# Patient Record
Sex: Female | Born: 1995
Health system: Southern US, Community
[De-identification: ages and names within clinical notes are randomized; demographics above are authoritative.]

## PROBLEM LIST (undated history)

## (undated) DIAGNOSIS — F909 Attention-deficit hyperactivity disorder, unspecified type: Secondary | ICD-10-CM

## (undated) DIAGNOSIS — K5090 Crohn's disease, unspecified, without complications: Secondary | ICD-10-CM

## (undated) DIAGNOSIS — M069 Rheumatoid arthritis, unspecified: Secondary | ICD-10-CM

## (undated) DIAGNOSIS — H209 Unspecified iridocyclitis: Secondary | ICD-10-CM

## (undated) HISTORY — PX: GLAUCOMA SURGERY: SHX656

## (undated) HISTORY — PX: CATARACT EXTRACTION: SUR2

## (undated) HISTORY — PX: COLONOSCOPY: SHX174

---
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
## 2017-11-12 ENCOUNTER — Encounter: Payer: Self-pay | Admitting: Podiatry

## 2017-11-12 ENCOUNTER — Ambulatory Visit (INDEPENDENT_AMBULATORY_CARE_PROVIDER_SITE_OTHER): Payer: BLUE CROSS/BLUE SHIELD | Admitting: Podiatry

## 2017-11-12 DIAGNOSIS — L600 Ingrowing nail: Secondary | ICD-10-CM | POA: Diagnosis not present

## 2017-11-12 NOTE — Patient Instructions (Signed)

## 2017-11-12 NOTE — Progress Notes (Signed)
Subjective:   Patient ID: Sydney Zimmerman, female   DOB: 22 y.o.   MRN: [id number]   HPI 22 year old female presents the office today for concerns of a possible ingrown toenail to the right big toe.  She states that she had a piece of nail come off and since part of the corner of the nail, she has not been having any pain.  She denies any drainage or pus and denies any swelling or redness.  She said no other treatment.  No recent injury.  She has no other concerns today.   Review of Systems  All other systems reviewed and are negative.  History reviewed. No pertinent past medical history.  History reviewed. No pertinent surgical history.  No current outpatient medications on file.  Allergies  Allergen Reactions  . Sulfamethoxazole-Trimethoprim Hives and Itching    Patient developed urticarial rash 3-4 days after starting Bactrim   . Ibuprofen   . Sulfamethoxazole Itching  . Adalimumab Rash          Objective:  Physical Exam  General: AAO x3, NAD  Dermatological: Along the medial aspect the right hallux toenail there is some evidence of dry skin within the nail corner but there is no significant incurvation.  It does appear that a piece of the medial distal portion of the nail did come off and there is no incurvation present.  There is no edema, erythema, drainage or pus and there is no clinical signs of infection.  There is no tenderness palpation the nail corner. No open lesions.  Vascular: Dorsalis Pedis artery and Posterior Tibial artery pedal pulses are 2/4 bilateral with immedate capillary fill time. There is no pain with calf compression, swelling, warmth, erythema.   Neruologic: Grossly intact via light touch bilateral. Protective threshold with Semmes Wienstein monofilament intact to all pedal sites bilateral.   Musculoskeletal: HAV is present. No pain, crepitus, or limitation noted with foot and ankle range of motion bilateral. Muscular strength 5/5 in all groups  tested bilateral.  Gait: Unassisted, Nonantalgic.       Assessment:   Ingrown toenail right hallux     Plan:  -Treatment options discussed including all alternatives, risks, and complications -Etiology of symptoms were discussed -At this time there is no pain there is no signs of infection.  There is no significant incurvation of the nail.  Because this we held off on a partial nail avulsion.  I did clean out the corner of the nail to remove some dried skin present.  Monitoring signs or symptoms of infection or any recurrence.  If she were to have issues I do think that a partial nail avulsion with chemical measures with me would be helpful for her but she may continue to monitor the area for now.  Follow back up as needed.  Call any questions or concerns.    Trula Slade DPM

## 2018-11-26 ENCOUNTER — Encounter: Payer: Self-pay | Admitting: Family Medicine

## 2019-03-21 ENCOUNTER — Encounter (HOSPITAL_COMMUNITY): Payer: Self-pay

## 2019-03-21 ENCOUNTER — Emergency Department (HOSPITAL_COMMUNITY): Payer: BC Managed Care – PPO

## 2019-03-21 ENCOUNTER — Other Ambulatory Visit: Payer: Self-pay

## 2019-03-21 ENCOUNTER — Observation Stay (HOSPITAL_COMMUNITY)
Admission: EM | Admit: 2019-03-21 | Discharge: 2019-03-22 | Disposition: A | Discharge status: Home / Self Care | Payer: BC Managed Care – PPO | Attending: Internal Medicine | Admitting: Internal Medicine

## 2019-03-21 DIAGNOSIS — Z886 Allergy status to analgesic agent status: Secondary | ICD-10-CM | POA: Insufficient documentation

## 2019-03-21 DIAGNOSIS — K5090 Crohn's disease, unspecified, without complications: Secondary | ICD-10-CM | POA: Diagnosis not present

## 2019-03-21 DIAGNOSIS — E876 Hypokalemia: Secondary | ICD-10-CM | POA: Diagnosis present

## 2019-03-21 DIAGNOSIS — R000 Tachycardia, unspecified: Secondary | ICD-10-CM | POA: Insufficient documentation

## 2019-03-21 DIAGNOSIS — Z20828 Contact with and (suspected) exposure to other viral communicable diseases: Secondary | ICD-10-CM | POA: Insufficient documentation

## 2019-03-21 DIAGNOSIS — Z888 Allergy status to other drugs, medicaments and biological substances status: Secondary | ICD-10-CM | POA: Diagnosis not present

## 2019-03-21 DIAGNOSIS — Z882 Allergy status to sulfonamides status: Secondary | ICD-10-CM | POA: Insufficient documentation

## 2019-03-21 DIAGNOSIS — R509 Fever, unspecified: Secondary | ICD-10-CM | POA: Diagnosis not present

## 2019-03-21 DIAGNOSIS — R6510 Systemic inflammatory response syndrome (SIRS) of non-infectious origin without acute organ dysfunction: Secondary | ICD-10-CM | POA: Diagnosis present

## 2019-03-21 DIAGNOSIS — R7989 Other specified abnormal findings of blood chemistry: Secondary | ICD-10-CM | POA: Insufficient documentation

## 2019-03-21 DIAGNOSIS — Z79899 Other long term (current) drug therapy: Secondary | ICD-10-CM | POA: Insufficient documentation

## 2019-03-21 DIAGNOSIS — M0800 Unspecified juvenile rheumatoid arthritis of unspecified site: Secondary | ICD-10-CM | POA: Diagnosis not present

## 2019-03-21 DIAGNOSIS — Z7952 Long term (current) use of systemic steroids: Secondary | ICD-10-CM | POA: Diagnosis not present

## 2019-03-21 DIAGNOSIS — Z881 Allergy status to other antibiotic agents status: Secondary | ICD-10-CM | POA: Insufficient documentation

## 2019-03-21 LAB — CBC WITH DIFFERENTIAL/PLATELET
Abs Immature Granulocytes: 0.03 10*3/uL (ref 0.00–0.07)
Basophils Absolute: 0 10*3/uL (ref 0.0–0.1)
Basophils Relative: 0 %
Eosinophils Absolute: 0.1 10*3/uL (ref 0.0–0.5)
Eosinophils Relative: 1 %
HCT: 38.6 % (ref 36.0–46.0)
Hemoglobin: 12.3 g/dL (ref 12.0–15.0)
Immature Granulocytes: 0 %
Lymphocytes Relative: 19 %
Lymphs Abs: 1.8 10*3/uL (ref 0.7–4.0)
MCH: 25.9 pg — ABNORMAL LOW (ref 26.0–34.0)
MCHC: 31.9 g/dL (ref 30.0–36.0)
MCV: 81.3 fL (ref 80.0–100.0)
Monocytes Absolute: 1.2 10*3/uL — ABNORMAL HIGH (ref 0.1–1.0)
Monocytes Relative: 13 %
Neutro Abs: 6.2 10*3/uL (ref 1.7–7.7)
Neutrophils Relative %: 67 %
Platelets: 468 10*3/uL — ABNORMAL HIGH (ref 150–400)
RBC: 4.75 MIL/uL (ref 3.87–5.11)
RDW: 15.9 % — ABNORMAL HIGH (ref 11.5–15.5)
WBC: 9.4 10*3/uL (ref 4.0–10.5)
nRBC: 0 % (ref 0.0–0.2)

## 2019-03-21 LAB — URINALYSIS, ROUTINE W REFLEX MICROSCOPIC
Bacteria, UA: NONE SEEN
Bilirubin Urine: NEGATIVE
Glucose, UA: NEGATIVE mg/dL
Ketones, ur: NEGATIVE mg/dL
Leukocytes,Ua: NEGATIVE
Nitrite: NEGATIVE
Protein, ur: NEGATIVE mg/dL
Specific Gravity, Urine: 1.027 (ref 1.005–1.030)
pH: 5 (ref 5.0–8.0)

## 2019-03-21 LAB — COMPREHENSIVE METABOLIC PANEL
ALT: 18 U/L (ref 0–44)
AST: 18 U/L (ref 15–41)
Albumin: 3.3 g/dL — ABNORMAL LOW (ref 3.5–5.0)
Alkaline Phosphatase: 61 U/L (ref 38–126)
Anion gap: 11 (ref 5–15)
BUN: 7 mg/dL (ref 6–20)
CO2: 23 mmol/L (ref 22–32)
Calcium: 8.9 mg/dL (ref 8.9–10.3)
Chloride: 101 mmol/L (ref 98–111)
Creatinine, Ser: 0.85 mg/dL (ref 0.44–1.00)
GFR calc Af Amer: >60 mL/min (ref >60)
GFR calc non Af Amer: >60 mL/min (ref >60)
Glucose, Bld: 93 mg/dL (ref 70–99)
Potassium: 3.3 mmol/L — ABNORMAL LOW (ref 3.5–5.1)
Sodium: 135 mmol/L (ref 135–145)
Total Bilirubin: 0.4 mg/dL (ref 0.3–1.2)
Total Protein: 8.9 g/dL — ABNORMAL HIGH (ref 6.5–8.1)

## 2019-03-21 LAB — PROTIME-INR
INR: 1.1 (ref 0.8–1.2)
Prothrombin Time: 14.1 seconds (ref 11.4–15.2)

## 2019-03-21 LAB — I-STAT BETA HCG BLOOD, ED (MC, WL, AP ONLY): I-stat hCG, quantitative: <5 m[IU]/mL (ref <5)

## 2019-03-21 LAB — LACTIC ACID, PLASMA: Lactic Acid, Venous: 1.3 mmol/L (ref 0.5–1.9)

## 2019-03-21 MED ORDER — SODIUM CHLORIDE 0.9 % IV BOLUS (SEPSIS)
1000.0000 mL | Freq: Once | INTRAVENOUS | Status: AC
  Administered 2019-03-21: 1000 mL via INTRAVENOUS

## 2019-03-21 MED ORDER — SODIUM CHLORIDE 0.9 % IV SOLN
1000.0000 mL | INTRAVENOUS | Status: DC
  Administered 2019-03-22: 1000 mL via INTRAVENOUS

## 2019-03-21 MED ORDER — HYDROCORTISONE NA SUCCINATE PF 100 MG IJ SOLR
100.0000 mg | Freq: Once | INTRAMUSCULAR | Status: AC
  Administered 2019-03-21: 100 mg via INTRAVENOUS
  Filled 2019-03-21: qty 2

## 2019-03-21 MED ORDER — ACETAMINOPHEN 325 MG PO TABS
650.0000 mg | ORAL_TABLET | Freq: Once | ORAL | Status: AC
  Administered 2019-03-21: 650 mg via ORAL
  Filled 2019-03-21: qty 2

## 2019-03-21 NOTE — ED Provider Notes (Signed)
[facility] EMERGENCY DEPARTMENT Provider Note   CSN: [id number] Arrival date & time: 03/21/19  2133     History   Chief Complaint Chief Complaint  Patient presents with   Fever    HPI Sydney Zimmerman is a 23 y.o. female with history of IBS, juvenile idiopathic arthritis, glaucoma Crohn's disease, atopic dermatitis, chronic iridocyclitis of right eye presents for evaluation of 1 week of fever, myalgias, right flank pain.  She reports very mild dull aching to the right flank which does not radiate, no aggravating or alleviating factors noted.  No known trauma, falls, bending or lifting or twisting injuries.  Denies any pain radiating to the lower extremities.  No bowel or bladder incontinence, saddle anesthesia, IV drug use, urinary symptoms.  She has had a few days of watery nonbloody stools but reports that this is not unusual for her in relation to her IBS or Crohn's disease.  She reports very mild generalized abdominal pain in the mornings which is also not unusual for her and denies any abdominal pain at this time.  Denies shortness of breath, cough, or shortness of breath.  She has had fevers at home which she has been treating with Tylenol.  Reports that she is typically on 5 mg of prednisone twice daily but ran out of her prednisone 1 week ago.  She states she had similar symptoms in August a few months ago and was tested for influenza, COVID-19, and pneumonia and reports she tested negative with improvement in symptoms.  No known sick contacts.  Of note, she does take Simponi (golimumab) injections once monthly which is an immunosuppressive medication.     The history is provided by the patient.    Past Medical History:  Diagnosis Date   Crohn disease ([place]) 03/22/2019    Patient Active Problem List   Diagnosis Date Noted   SIRS (systemic inflammatory response syndrome) ([place]) 03/22/2019   Hypokalemia 03/22/2019   Crohn disease ([place]) 03/22/2019   Juvenile  rheumatoid arthritis ([place]) 03/22/2019    History reviewed. No pertinent surgical history.   OB History   No obstetric history on file.      Home Medications    Prior to Admission medications   Not on File    Family History History reviewed. No pertinent family history.  Social History Social History   Tobacco Use   Smoking status: Not on file  Substance Use Topics   Alcohol use: Not on file   Drug use: Not on file     Allergies   Sulfamethoxazole-trimethoprim, Ibuprofen, Sulfamethoxazole, and Adalimumab   Review of Systems Review of Systems  Constitutional: Positive for chills and fever.  Respiratory: Negative for cough and shortness of breath.   Cardiovascular: Negative for chest pain.  Gastrointestinal: Positive for abdominal pain (intermittent) and diarrhea. Negative for nausea and vomiting.  Genitourinary: Positive for flank pain.  Musculoskeletal: Positive for arthralgias and myalgias. Negative for neck pain and neck stiffness.  Neurological: Negative for weakness, numbness and headaches.  All other systems reviewed and are negative.    Physical Exam Updated Vital Signs BP 111/67    Pulse 93    Temp 98.2 F (36.8 C) (Oral)    Resp 12    Ht 5' 3"  (1.6 m)    Wt 63.5 kg    LMP 03/07/2019 (Approximate)    SpO2 99%    BMI 24.80 kg/m   Physical Exam Vitals signs and nursing note reviewed.  Constitutional:  General: She is not in acute distress.    Appearance: She is well-developed.  HENT:     Head: Normocephalic and atraumatic.  Eyes:     General:        Right eye: No discharge.        Left eye: No discharge.     Conjunctiva/sclera: Conjunctivae normal.  Neck:     Musculoskeletal: Normal range of motion and neck supple. No neck rigidity.     Vascular: No JVD.     Trachea: No tracheal deviation.  Cardiovascular:     Rate and Rhythm: Regular rhythm. Tachycardia present.     Pulses: Normal pulses.     Heart sounds: Normal heart sounds.      Comments: 2+ radial and DP/PT pulses bilaterally, Homans sign absent bilaterally, no lower extremity edema, no palpable cords, compartments are soft  Pulmonary:     Effort: Pulmonary effort is normal.     Breath sounds: Normal breath sounds.  Abdominal:     General: Abdomen is flat. Bowel sounds are normal. There is no distension.     Palpations: Abdomen is soft.     Tenderness: There is no abdominal tenderness. There is no right CVA tenderness, left CVA tenderness, guarding or rebound.  Musculoskeletal:       Arms:     Comments: No midline lumbar spine tenderness, no deformity, crepitus, or step-off noted.  Mild right lower  paralumbar muscle tenderness.  5/5 strength of BLE major muscle groups.  Negative straight leg raise bilaterally  Skin:    General: Skin is warm and dry.     Findings: No erythema.  Neurological:     Mental Status: She is alert.     Comments: Fluent speech, no facial droop, sensation intact to soft touch of bilateral lower extremities.   Psychiatric:        Behavior: Behavior normal.      ED Treatments / Results  Labs (all labs ordered are listed, but only abnormal results are displayed) Labs Reviewed  COMPREHENSIVE METABOLIC PANEL - Abnormal; Notable for the following components:      Result Value   Potassium 3.3 (*)    Total Protein 8.9 (*)    Albumin 3.3 (*)    All other components within normal limits  CBC WITH DIFFERENTIAL/PLATELET - Abnormal; Notable for the following components:   MCH 25.9 (*)    RDW 15.9 (*)    Platelets 468 (*)    Monocytes Absolute 1.2 (*)    All other components within normal limits  URINALYSIS, ROUTINE W REFLEX MICROSCOPIC - Abnormal; Notable for the following components:   APPearance HAZY (*)    Hgb urine dipstick SMALL (*)    All other components within normal limits  SARS CORONAVIRUS 2 (HOSPITAL ORDER, [facility])  URINE CULTURE  CULTURE, BLOOD (ROUTINE X 2)  CULTURE, BLOOD (ROUTINE X 2)    LACTIC ACID, PLASMA  LACTIC ACID, PLASMA  PROTIME-INR  I-STAT BETA HCG BLOOD, ED ([facility], [facility], [facility] ONLY)    EKG EKG Interpretation  Date/Time:  Monday March 21 2019 21:51:43 EDT Ventricular Rate:  149 PR Interval:  126 QRS Duration: 68 QT Interval:  212 QTC Calculation: 333 R Axis:   87 Text Interpretation:  Sinus tachycardia Right atrial enlargement ST & T wave abnormality, consider inferior ischemia Abnormal ECG No old tracing to compare Confirmed by Noemi Chapel [phone]) on 03/21/2019 10:31:40 PM   Radiology Ct Abdomen Pelvis W Contrast  Result  Date: 03/22/2019 CLINICAL DATA:  Flank pain, fever EXAM: CT ABDOMEN AND PELVIS WITH CONTRAST TECHNIQUE: Multidetector CT imaging of the abdomen and pelvis was performed using the standard protocol following bolus administration of intravenous contrast. CONTRAST:  12m OMNIPAQUE IOHEXOL 300 MG/ML  SOLN COMPARISON:  None. FINDINGS: Lower chest: The visualized heart size within normal limits. No pericardial fluid/thickening. No hiatal hernia. The visualized portions of the lungs are clear. Hepatobiliary: The liver is normal in density without focal abnormality.The main portal vein is patent. No evidence of calcified gallstones, gallbladder wall thickening or biliary dilatation. Pancreas: Unremarkable. No pancreatic ductal dilatation or surrounding inflammatory changes. Spleen: Normal in size without focal abnormality. Adrenals/Urinary Tract: Both adrenal glands appear normal. The kidneys and collecting system are normal in appearance. No hydronephrosis or perinephric stranding. There does appear to be mild bladder wall thickening with question of surrounding superior bladder fat stranding changes. Stomach/Bowel: The stomach, small bowel, and colon are normal in appearance. No inflammatory changes, wall thickening, or obstructive findings.The appendix is normal. Vascular/Lymphatic: There are no enlarged mesenteric, retroperitoneal, or pelvic lymph nodes. No  significant vascular findings are present. Reproductive: The uterus and adnexa are unremarkable. a small amount of free fluid seen within the cul-de-sac. Other: No evidence of abdominal wall mass or hernia. Musculoskeletal: No acute or significant osseous findings. IMPRESSION: Findings which could be suggestive of cystitis. No renal or collecting system calculi. Electronically Signed   By: BPrudencio PairM.D.   On: 03/22/2019 00:53   Dg Chest Portable 1 View  Result Date: 03/21/2019 CLINICAL DATA:  Fever EXAM: PORTABLE CHEST 1 VIEW COMPARISON:  None. FINDINGS: The heart size and mediastinal contours are within normal limits. Both lungs are clear. The visualized skeletal structures are unremarkable. IMPRESSION: No active disease. Electronically Signed   By: BPrudencio PairM.D.   On: 03/21/2019 23:20    Procedures .Critical Care Performed by: FRenita Papa PA-C Authorized by: FRenita Papa PA-C   Critical care provider statement:    Critical care time (minutes):  45   Critical care was necessary to treat or prevent imminent or life-threatening deterioration of the following conditions:  Sepsis   Critical care was time spent personally by me on the following activities:  Discussions with consultants, evaluation of patient's response to treatment, examination of patient, ordering and performing treatments and interventions, ordering and review of laboratory studies, ordering and review of radiographic studies, pulse oximetry, re-evaluation of patient's condition, obtaining history from patient or surrogate and review of old charts   (including critical care time)  Medications Ordered in ED Medications  sodium chloride 0.9 % bolus 1,000 mL (0 mLs Intravenous Stopped 03/21/19 2257)    Followed by  sodium chloride 0.9 % bolus 1,000 mL (0 mLs Intravenous Stopped 03/21/19 2353)    Followed by  0.9 %  sodium chloride infusion (1,000 mLs Intravenous New Bag/Given 03/22/19 0303)  vancomycin (VANCOCIN) 1,250  mg in sodium chloride 0.9 % 250 mL IVPB (1,250 mg Intravenous New Bag/Given 03/22/19 0417)  vancomycin (VANCOCIN) IVPB 750 mg/150 ml premix (has no administration in time range)  potassium chloride SA (KLOR-CON) CR tablet 40 mEq (has no administration in time range)  ceFEPIme (MAXIPIME) 2 g in sodium chloride 0.9 % 100 mL IVPB (has no administration in time range)  acetaminophen (TYLENOL) tablet 650 mg (650 mg Oral Given 03/21/19 2154)  hydrocortisone sodium succinate (SOLU-CORTEF) 100 MG injection 100 mg (100 mg Intravenous Given 03/21/19 2323)  iohexol (OMNIPAQUE) 300 MG/ML solution 100  mL (100 mLs Intravenous Contrast Given 03/22/19 0016)  cefTRIAXone (ROCEPHIN) 1 g in sodium chloride 0.9 % 100 mL IVPB (0 g Intravenous Stopped 03/22/19 0317)  metroNIDAZOLE (FLAGYL) IVPB 500 mg (500 mg Intravenous New Bag/Given 03/22/19 0306)     Initial Impression / Assessment and Plan / ED Course  I have reviewed the triage vital signs and the nursing notes.  Pertinent labs & imaging results that were available during my care of the patient were reviewed by me and considered in my medical decision making (see chart for details).       Sydney Zimmerman was evaluated in Emergency Department on 03/22/2019 for the symptoms described in the history of present illness. She was evaluated in the context of the global COVID-19 pandemic, which necessitated consideration that the patient might be at risk for infection with the SARS-CoV-2 virus that causes COVID-19. Institutional protocols and algorithms that pertain to the evaluation of patients at risk for COVID-19 are in a state of rapid change based on information released by regulatory bodies including the CDC and federal and state organizations. These policies and algorithms were followed during the patient's care in the ED.  Patient presenting for evaluation of fever, myalgias, right flank pain for 1 week.  She is febrile to 102.9 F and tachycardia to 148 bpm on initial  examination.  She was given a 30 cc/kg bolus of fluids with some improvement in her heart rate but on reassessment in the room she is still tachycardic to the 120s.  Overall well-appearing, examination of the abdomen is benign.  Lungs clear to auscultation bilaterally and SPO2 saturations are within normal limits.  We will obtain blood cultures, blood work, chest x-ray and UA to search for source of infection.  She is at risk of serious infections due to her immunocompromised status.  Lab work today reviewed by me shows no leukocytosis, no anemia, no metabolic derangements.  She is mildly hypokalemic, replenished orally in the ED.  No renal insufficiency.  Lactate within normal limits and she does not appear to be overtly septic at this time however does meet sirs criteria.  CT of the abdomen and pelvis shows possible mild bladder wall thickening with question of surrounding fat stranding changes which could suggest cystitis however her UA is not concerning for UTI.  We will culture.  Chest x-ray shows no acute cardiopulmonary abnormalities.  COVID test is negative.  Will start on broad-spectrum antibiotics and admit for fever of unknown origin.  Spoke with Dr. Myna Hidalgo with [facility] hospitalist service who agrees to assume care of patient and bring her into the hospital for further evaluation and management.  Final Clinical Impressions(s) / ED Diagnoses   Final diagnoses:  Fever of unknown origin    ED Discharge Orders    None       Debroah Baller 03/22/19 Eulogio Bear, MD 03/25/19 [phone]

## 2019-03-21 NOTE — ED Triage Notes (Signed)
Pt states that she has not been feel good for the past week, nausea, some dizziness, fevers. HR 150, fever 102

## 2019-03-22 ENCOUNTER — Encounter (HOSPITAL_COMMUNITY): Payer: Self-pay | Admitting: Family Medicine

## 2019-03-22 ENCOUNTER — Emergency Department (HOSPITAL_COMMUNITY): Payer: BC Managed Care – PPO

## 2019-03-22 ENCOUNTER — Other Ambulatory Visit: Payer: Self-pay

## 2019-03-22 DIAGNOSIS — E876 Hypokalemia: Secondary | ICD-10-CM | POA: Diagnosis not present

## 2019-03-22 DIAGNOSIS — R6510 Systemic inflammatory response syndrome (SIRS) of non-infectious origin without acute organ dysfunction: Secondary | ICD-10-CM | POA: Diagnosis present

## 2019-03-22 DIAGNOSIS — K5090 Crohn's disease, unspecified, without complications: Secondary | ICD-10-CM | POA: Diagnosis present

## 2019-03-22 DIAGNOSIS — M0800 Unspecified juvenile rheumatoid arthritis of unspecified site: Secondary | ICD-10-CM | POA: Diagnosis present

## 2019-03-22 DIAGNOSIS — K50919 Crohn's disease, unspecified, with unspecified complications: Secondary | ICD-10-CM

## 2019-03-22 HISTORY — DX: Crohn's disease, unspecified, without complications: K50.90

## 2019-03-22 LAB — CBC WITH DIFFERENTIAL/PLATELET
Abs Immature Granulocytes: 0.02 10*3/uL (ref 0.00–0.07)
Basophils Absolute: 0 10*3/uL (ref 0.0–0.1)
Basophils Relative: 0 %
Eosinophils Absolute: 0 10*3/uL (ref 0.0–0.5)
Eosinophils Relative: 0 %
HCT: 33.4 % — ABNORMAL LOW (ref 36.0–46.0)
Hemoglobin: 10.9 g/dL — ABNORMAL LOW (ref 12.0–15.0)
Immature Granulocytes: 0 %
Lymphocytes Relative: 12 %
Lymphs Abs: 0.8 10*3/uL (ref 0.7–4.0)
MCH: 26.7 pg (ref 26.0–34.0)
MCHC: 32.6 g/dL (ref 30.0–36.0)
MCV: 81.9 fL (ref 80.0–100.0)
Monocytes Absolute: 0.5 10*3/uL (ref 0.1–1.0)
Monocytes Relative: 7 %
Neutro Abs: 5.5 10*3/uL (ref 1.7–7.7)
Neutrophils Relative %: 81 %
Platelets: 394 10*3/uL (ref 150–400)
RBC: 4.08 MIL/uL (ref 3.87–5.11)
RDW: 15.6 % — ABNORMAL HIGH (ref 11.5–15.5)
WBC: 6.8 10*3/uL (ref 4.0–10.5)
nRBC: 0 % (ref 0.0–0.2)

## 2019-03-22 LAB — BASIC METABOLIC PANEL
Anion gap: 9 (ref 5–15)
BUN: 6 mg/dL (ref 6–20)
CO2: 21 mmol/L — ABNORMAL LOW (ref 22–32)
Calcium: 8.2 mg/dL — ABNORMAL LOW (ref 8.9–10.3)
Chloride: 108 mmol/L (ref 98–111)
Creatinine, Ser: 0.62 mg/dL (ref 0.44–1.00)
GFR calc Af Amer: >60 mL/min (ref >60)
GFR calc non Af Amer: >60 mL/min (ref >60)
Glucose, Bld: 106 mg/dL — ABNORMAL HIGH (ref 70–99)
Potassium: 3.8 mmol/L (ref 3.5–5.1)
Sodium: 138 mmol/L (ref 135–145)

## 2019-03-22 LAB — URINE CULTURE: Culture: NO GROWTH

## 2019-03-22 LAB — SARS CORONAVIRUS 2 BY RT PCR (HOSPITAL ORDER, PERFORMED IN ~~LOC~~ HOSPITAL LAB): SARS Coronavirus 2: NEGATIVE

## 2019-03-22 LAB — MAGNESIUM: Magnesium: 2 mg/dL (ref 1.7–2.4)

## 2019-03-22 LAB — HIV ANTIBODY (ROUTINE TESTING W REFLEX): HIV Screen 4th Generation wRfx: NONREACTIVE

## 2019-03-22 LAB — LACTIC ACID, PLASMA: Lactic Acid, Venous: 0.9 mmol/L (ref 0.5–1.9)

## 2019-03-22 MED ORDER — SODIUM CHLORIDE 0.9 % IV SOLN
INTRAVENOUS | Status: AC
  Administered 2019-03-22 (×2): via INTRAVENOUS

## 2019-03-22 MED ORDER — SODIUM CHLORIDE 0.9 % IV SOLN
1.0000 g | Freq: Once | INTRAVENOUS | Status: AC
  Administered 2019-03-22: 1 g via INTRAVENOUS
  Filled 2019-03-22: qty 10

## 2019-03-22 MED ORDER — HYDROCODONE-ACETAMINOPHEN 5-325 MG PO TABS: 1.0000 | ORAL_TABLET | ORAL | Status: DC | PRN

## 2019-03-22 MED ORDER — ACETAMINOPHEN 325 MG PO TABS
650.0000 mg | ORAL_TABLET | Freq: Four times a day (QID) | ORAL | Status: DC | PRN
  Administered 2019-03-22: 650 mg via ORAL
  Filled 2019-03-22: qty 2

## 2019-03-22 MED ORDER — ENOXAPARIN SODIUM 40 MG/0.4ML ~~LOC~~ SOLN
40.0000 mg | SUBCUTANEOUS | Status: DC
  Filled 2019-03-22: qty 0.4

## 2019-03-22 MED ORDER — VANCOMYCIN HCL 10 G IV SOLR
1250.0000 mg | Freq: Once | INTRAVENOUS | Status: AC
  Administered 2019-03-22: 1250 mg via INTRAVENOUS
  Filled 2019-03-22: qty 1250

## 2019-03-22 MED ORDER — POTASSIUM CHLORIDE CRYS ER 20 MEQ PO TBCR
40.0000 meq | EXTENDED_RELEASE_TABLET | Freq: Once | ORAL | Status: AC
  Administered 2019-03-22: 40 meq via ORAL
  Filled 2019-03-22: qty 2

## 2019-03-22 MED ORDER — IOHEXOL 300 MG/ML  SOLN
100.0000 mL | Freq: Once | INTRAMUSCULAR | Status: AC | PRN
  Administered 2019-03-22: 100 mL via INTRAVENOUS

## 2019-03-22 MED ORDER — ONDANSETRON HCL 4 MG/2ML IJ SOLN: 4.0000 mg | Freq: Four times a day (QID) | INTRAMUSCULAR | Status: DC | PRN

## 2019-03-22 MED ORDER — PREDNISONE 5 MG PO TABS
5.0000 mg | ORAL_TABLET | Freq: Two times a day (BID) | ORAL | Status: DC
  Administered 2019-03-22: 5 mg via ORAL
  Filled 2019-03-22 (×2): qty 1

## 2019-03-22 MED ORDER — VANCOMYCIN HCL IN DEXTROSE 750-5 MG/150ML-% IV SOLN
750.0000 mg | Freq: Two times a day (BID) | INTRAVENOUS | Status: DC
  Filled 2019-03-22: qty 150

## 2019-03-22 MED ORDER — METRONIDAZOLE IN NACL 5-0.79 MG/ML-% IV SOLN
500.0000 mg | Freq: Once | INTRAVENOUS | Status: AC
  Administered 2019-03-22: 500 mg via INTRAVENOUS
  Filled 2019-03-22: qty 100

## 2019-03-22 MED ORDER — ACETAMINOPHEN 650 MG RE SUPP: 650.0000 mg | Freq: Four times a day (QID) | RECTAL | Status: DC | PRN

## 2019-03-22 MED ORDER — PREDNISONE 5 MG PO TABS: 5.0000 mg | ORAL_TABLET | Freq: Two times a day (BID) | ORAL | 0 refills | Status: DC

## 2019-03-22 MED ORDER — PREDNISONE 10 MG PO TABS: ORAL_TABLET | ORAL | 0 refills | Status: DC

## 2019-03-22 MED ORDER — METRONIDAZOLE IN NACL 5-0.79 MG/ML-% IV SOLN
500.0000 mg | Freq: Three times a day (TID) | INTRAVENOUS | Status: DC
  Administered 2019-03-22: 500 mg via INTRAVENOUS
  Filled 2019-03-22: qty 100

## 2019-03-22 MED ORDER — ONDANSETRON HCL 4 MG PO TABS: 4.0000 mg | ORAL_TABLET | Freq: Four times a day (QID) | ORAL | Status: DC | PRN

## 2019-03-22 MED ORDER — VANCOMYCIN HCL IN DEXTROSE 1-5 GM/200ML-% IV SOLN: 1000.0000 mg | Freq: Once | INTRAVENOUS | Status: DC

## 2019-03-22 MED ORDER — SODIUM CHLORIDE 0.9 % IV SOLN
2.0000 g | Freq: Three times a day (TID) | INTRAVENOUS | Status: DC
  Administered 2019-03-22: 2 g via INTRAVENOUS
  Filled 2019-03-22: qty 2

## 2019-03-22 MED FILL — predniSONE 5 MG TABS: 5 | 15 days supply | Qty: 30 | Fill #0

## 2019-03-22 MED FILL — predniSONE 10 MG TABS: 10 | 10 days supply | Qty: 30 | Fill #0

## 2019-03-22 NOTE — ED Notes (Addendum)
Pt lying on bed, alert, on her cell phone. Female visitor remains at bedside. Pt states she was advised of tx plan - d/c to home. [facility] and advised will bring pt's meds.

## 2019-03-22 NOTE — Progress Notes (Addendum)
Patient admitted after midnight but care began before midnight in the ED.   23 yo hx chrons, juvenile RA taking chronic steroids (that she ran out of several days ago) admitted with SIRS. Max temp 102, tachycardia, tachypnea.   A/P 1. SIRS  Presented with 1 week of fever/chills and  found to be febrile and tachycardic with source not identified. Home meds include golimumab for RA and IBD, placing her at increased risk for life-threatening infections.  Lactic acid within limits of normal. Urinalysis unremarkable, chest xray no active disease.  Provided with vigorous IV fluids and  broad-spectrum antibiotics empirically  - Continue broad-spectrum antibiotics for now - follow blood  Cultures -continue vigorous IV fluids    2. Crohn's disease; juvenal rheumatoid arthritis   Patient reports these to be stable  - Continue prednisone   3. Hypokalemia  - Replaced, repeat chem panel in am    Dyanne Carrel, NP   Addendum:  Please see d/c summary.   Debbe Odea, MD

## 2019-03-22 NOTE — ED Notes (Signed)
MS ordered bfast

## 2019-03-22 NOTE — Progress Notes (Addendum)
Pharmacy Antibiotic Note  Sydney Zimmerman is a 23 y.o. female admitted on 03/21/2019 with empiric for unknown source.  Pharmacy has been consulted for vancomycin dosing.  Presenting with 1 week of fever, myalgias, and R flank pain. UA neg. CT image suggestive of cystitis. Hx Crohn's, IBS, idiopathic arthritis. WBC 9.4, LA 1.3>0.9, temp on admit 102.9> afebrile w/ APAP dosage. Scr 0.85 (CrCl 93 mL/min).   Plan: Vancomycin 1250 mg IV once then 750 mg IV every 12 hours  Cefepime 2 g IV every 8 hours Continue metronidazole per MD Monitor renal fx, cx results, clinical pic, opportunities for de-escalation, and vanc levels as appropriate  Height: 5' 3"  (160 cm) Weight: 140 lb (63.5 kg) IBW/kg (Calculated) : 52.4  Temp (24hrs), Avg:100.3 F (37.9 C), Min:98.2 F (36.8 C), Max:102.9 F (39.4 C)  Recent Labs  Lab 03/21/19 2200 03/22/19 0114  WBC 9.4  --   CREATININE 0.85  --   LATICACIDVEN 1.3 0.9    Estimated Creatinine Clearance: 93.1 mL/min (by C-G formula based on SCr of 0.85 mg/dL).    Allergies  Allergen Reactions  . Sulfamethoxazole-Trimethoprim Hives and Itching    Patient developed urticarial rash 3-4 days after starting Bactrim   . Ibuprofen   . Sulfamethoxazole Itching  . Adalimumab Rash    Antimicrobials this admission: Vancomycin 10/6 >>  Ceftriaxone 10/6 x1 Cefepime 10/6>>  Metronidazole 10/6>>  Dose adjustments this admission: N/A  Microbiology results: 10/6 COVID: neg 10/5 BCx: sent 10/5 UCx: sent   Thank you for allowing pharmacy to be a part of this patient's care.  Antonietta Jewel, PharmD, BCCCP Clinical Pharmacist   Please check AMION for all [place] phone numbers After 10:00 PM, call [place] [phone] 03/22/2019 2:39 AM

## 2019-03-22 NOTE — Discharge Summary (Addendum)
Physician Discharge Summary  Jesse Hirst TDS:[id number] DOB: 01-30-1996 DOA: 03/21/2019  PCP: Patient, No Pcp Per  Admit date: 03/21/2019 Discharge date: 03/22/2019  Time spent: 45 minutes  Recommendations for Outpatient Follow-up:  1. Follow up with Dr Shon Baton in 1-2 weeks for evaluation of symptoms   Discharge Diagnoses:  Principal Problem:   SIRS (systemic inflammatory response syndrome) (HCC) Active Problems:   Hypokalemia   Crohn disease ([place])   Juvenile rheumatoid arthritis ([place])   Discharge Condition: stable  Diet recommendation: regular  Filed Weights   03/21/19 2146  Weight: 63.5 kg    History of present illness:  Sydney Zimmerman is a 23 y.o. female with medical history significant for Crohn's disease and juvenile rheumatoid arthritis, presented to the emergency department 10/6 for evaluation of general malaise with fevers.  Patient reported that she developed fevers and chills close to a week prior, and had ongoing fevers, chills, sweats, and general malaise since then.  She had a couple loose stools early in the course but denied any further diarrhea, abdominal pain, or vomiting.  She denied any dysuria.  She denied cough, shortness of breath, rhinorrhea, or sore throat.  Denied neck stiffness or headaches.  She denied any wounds or rash.  No sick contacts or recent travel.  She ran out of her prednisone a few days prior as well.  Her Crohn's and rheumatoid arthritis have been fairly stable though she  had some general aches for the previous few days that she is attributing to the fevers.  Hospital Course:  1.SIRSPresented with 1 week of fever/chills and  found to be febrile and tachycardic with source not identified. Likely related to steroids. Home meds include golimumab for RA and IBD, placing her at increased risk for life-threatening infections.  Lactic acid within limits of normal. Urinalysis unremarkable, chest xray no active disease.  Provided with vigorous IV  fluids and  broad-spectrum antibiotics empiricallyas well as 129m Solucortef. This am symptoms resolved. Spoke to Dr. BShon Batonpatients rheumatilogist who recommended 10 day taper to baseline dose of 584mbid. Will follow blood cultures  2.Crohn's disease; juvenal rheumatoid arthritisPatient reported these to be stable but reported 2-4 stools during night with cramps.Continue prednisone  3.HypokalemiaReplaced and resolved.    Procedures:    Consultations:  [facility]: Vitals:   03/22/19 0703 03/22/19 1141  BP: 110/67 112/66  Pulse: (!) 104 84  Resp: (!) 24 16  Temp:  98.7 F (37.1 C)  SpO2: 99% 99%    General: awake alert oriented Cardiovascular: rrr no mgr no LE edema Respiratory: normal effort BS clear bilaterally  Discharge Instructions   Discharge Instructions    Diet - low sodium heart healthy   Complete by: As directed    Discharge instructions   Complete by: As directed    Take medications as prescribed.  Follow up with Dr. BeShon Batonn 1-2 weeks   Increase activity slowly   Complete by: As directed      Allergies as of 03/22/2019      Reactions   Sulfamethoxazole-trimethoprim Hives, Itching   Patient developed urticarial rash 3-4 days after starting Bactrim   Ibuprofen    Sulfamethoxazole Itching   Adalimumab Rash      Medication List    TAKE these medications   acetaminophen 650 MG CR tablet Commonly known as: TYLENOL Take 1,300 mg by mouth every 8 (eight) hours as needed for pain.   nepafenac 0.1 % ophthalmic  suspension Commonly known as: [place] 1 drop into the right eye 2 (two) times daily.   prednisoLONE acetate 1 % ophthalmic suspension Commonly known as: PRED FORTE Place 1 drop into the right eye daily.   predniSONE 10 MG tablet Commonly known as: DELTASONE Take take 5 tabs for 2 days starting 10/6 then take 4 tabs for 2 days then take 3 tabs for 2 days then take 2 tabs for 2  days then take 1 tabs for 2 days.   predniSONE 5 MG tablet Commonly known as: DELTASONE Take 1 tablet (5 mg total) by mouth 2 (two) times daily with a meal. Start taking on: April 01, 2019   Simponi 50 MG/0.5ML Soaj Generic drug: Golimumab Inject 0.5 mLs into the skin every 28 (twenty-eight) days.      Allergies  Allergen Reactions  . Sulfamethoxazole-Trimethoprim Hives and Itching    Patient developed urticarial rash 3-4 days after starting Bactrim   . Ibuprofen   . Sulfamethoxazole Itching  . Adalimumab Rash      The results of significant diagnostics from this hospitalization (including imaging, microbiology, ancillary and laboratory) are listed below for reference.    Significant Diagnostic Studies: Ct Abdomen Pelvis W Contrast  Result Date: 03/22/2019 CLINICAL DATA:  Flank pain, fever EXAM: CT ABDOMEN AND PELVIS WITH CONTRAST TECHNIQUE: Multidetector CT imaging of the abdomen and pelvis was performed using the standard protocol following bolus administration of intravenous contrast. CONTRAST:  188m OMNIPAQUE IOHEXOL 300 MG/ML  SOLN COMPARISON:  None. FINDINGS: Lower chest: The visualized heart size within normal limits. No pericardial fluid/thickening. No hiatal hernia. The visualized portions of the lungs are clear. Hepatobiliary: The liver is normal in density without focal abnormality.The main portal vein is patent. No evidence of calcified gallstones, gallbladder wall thickening or biliary dilatation. Pancreas: Unremarkable. No pancreatic ductal dilatation or surrounding inflammatory changes. Spleen: Normal in size without focal abnormality. Adrenals/Urinary Tract: Both adrenal glands appear normal. The kidneys and collecting system are normal in appearance. No hydronephrosis or perinephric stranding. There does appear to be mild bladder wall thickening with question of surrounding superior bladder fat stranding changes. Stomach/Bowel: The stomach, small bowel, and colon are  normal in appearance. No inflammatory changes, wall thickening, or obstructive findings.The appendix is normal. Vascular/Lymphatic: There are no enlarged mesenteric, retroperitoneal, or pelvic lymph nodes. No significant vascular findings are present. Reproductive: The uterus and adnexa are unremarkable. a small amount of free fluid seen within the cul-de-sac. Other: No evidence of abdominal wall mass or hernia. Musculoskeletal: No acute or significant osseous findings. IMPRESSION: Findings which could be suggestive of cystitis. No renal or collecting system calculi. Electronically Signed   By: BPrudencio PairM.D.   On: 03/22/2019 00:53   Dg Chest Portable 1 View  Result Date: 03/21/2019 CLINICAL DATA:  Fever EXAM: PORTABLE CHEST 1 VIEW COMPARISON:  None. FINDINGS: The heart size and mediastinal contours are within normal limits. Both lungs are clear. The visualized skeletal structures are unremarkable. IMPRESSION: No active disease. Electronically Signed   By: BPrudencio PairM.D.   On: 03/21/2019 23:20    Microbiology: Recent Results (from the past 240 hour(s))  Culture, blood (Routine x 2)     Status: None (Preliminary result)   Collection Time: 03/21/19  9:45 PM   Specimen: BLOOD RIGHT ARM  Result Value Ref Range Status   Specimen Description BLOOD RIGHT ARM  Final   Special Requests   Final    BOTTLES DRAWN AEROBIC AND ANAEROBIC Blood Culture  results may not be optimal due to an excessive volume of blood received in culture bottles   Culture   Final    NO GROWTH < 24 HOURS Performed at [facility] [address] [place], [place] [address]    Report Status PENDING  Incomplete  Urine culture     Status: None (Preliminary result)   Collection Time: 03/21/19  9:58 PM   Specimen: Urine, Random  Result Value Ref Range Status   Specimen Description URINE, RANDOM  Final   Special Requests   Final    NONE Performed at [facility], [place] [address], [place], [place] [address]     Culture PENDING  Incomplete   Report Status PENDING  Incomplete  Culture, blood (Routine x 2)     Status: None (Preliminary result)   Collection Time: 03/21/19 10:00 PM   Specimen: BLOOD LEFT ARM  Result Value Ref Range Status   Specimen Description BLOOD LEFT ARM  Final   Special Requests   Final    BOTTLES DRAWN AEROBIC ONLY Blood Culture results may not be optimal due to an excessive volume of blood received in culture bottles   Culture   Final    NO GROWTH < 24 HOURS Performed at [facility], [place] [address], [place], [place] [address]    Report Status PENDING  Incomplete  SARS Coronavirus 2 [facility] order, Performed in [facility] hospital lab) Nasopharyngeal Nasopharyngeal Swab     Status: None   Collection Time: 03/22/19 12:09 AM   Specimen: Nasopharyngeal Swab  Result Value Ref Range Status   SARS Coronavirus 2 NEGATIVE NEGATIVE Final    Comment: (NOTE) If result is NEGATIVE SARS-CoV-2 target nucleic acids are NOT DETECTED. The SARS-CoV-2 RNA is generally detectable in upper and lower  respiratory specimens during the acute phase of infection. The lowest  concentration of SARS-CoV-2 viral copies this assay can detect is 250  copies / mL. A negative result does not preclude SARS-CoV-2 infection  and should not be used as the sole basis for treatment or other  patient management decisions.  A negative result may occur with  improper specimen collection / handling, submission of specimen other  than nasopharyngeal swab, presence of viral mutation(s) within the  areas targeted by this assay, and inadequate number of viral copies  (<250 copies / mL). A negative result must be combined with clinical  observations, patient history, and epidemiological information. If result is POSITIVE SARS-CoV-2 target nucleic acids are DETECTED. The SARS-CoV-2 RNA is generally detectable in upper and lower  respiratory specimens dur ing the acute phase of infection.  Positive  results  are indicative of active infection with SARS-CoV-2.  Clinical  correlation with patient history and other diagnostic information is  necessary to determine patient infection status.  Positive results do  not rule out bacterial infection or co-infection with other viruses. If result is PRESUMPTIVE POSTIVE SARS-CoV-2 nucleic acids MAY BE PRESENT.   A presumptive positive result was obtained on the submitted specimen  and confirmed on repeat testing.  While 2019 novel coronavirus  (SARS-CoV-2) nucleic acids may be present in the submitted sample  additional confirmatory testing may be necessary for epidemiological  and / or clinical management purposes  to differentiate between  SARS-CoV-2 and other Sarbecovirus currently known to infect humans.  If clinically indicated additional testing with an alternate test  methodology [phone]) is advised. The SARS-CoV-2 RNA is generally  detectable in upper and lower respiratory  sp ecimens during the acute  phase of infection. The expected result is Negative. Fact Sheet for Patients:  StrictlyIdeas.no Fact Sheet for Healthcare Providers: BankingDealers.co.za This test is not yet approved or cleared by the [place] FDA and has been authorized for detection and/or diagnosis of SARS-CoV-2 by FDA under an Emergency Use Authorization (EUA).  This EUA will remain in effect (meaning this test can be used) for the duration of the COVID-19 declaration under Section 564(b)(1) of the Act, 21 U.S.C. section 360bbb-3(b)(1), unless the authorization is terminated or revoked sooner. Performed at [facility], [place] [address] [place], [place] [address]      Labs: Basic Metabolic Panel: Recent Labs  Lab 03/21/19 2200 03/22/19 0744  NA 135 138  K 3.3* 3.8  CL 101 108  CO2 23 21*  GLUCOSE 93 106*  BUN 7 6  CREATININE 0.85 0.62  CALCIUM 8.9 8.2*  MG  --  2.0   Liver Function Tests: Recent Labs   Lab 03/21/19 2200  AST 18  ALT 18  ALKPHOS 61  BILITOT 0.4  PROT 8.9*  ALBUMIN 3.3*   No results for input(s): LIPASE, AMYLASE in the last 168 hours. No results for input(s): AMMONIA in the last 168 hours. CBC: Recent Labs  Lab 03/21/19 2200 03/22/19 0744  WBC 9.4 6.8  NEUTROABS 6.2 5.5  HGB 12.3 10.9*  HCT 38.6 33.4*  MCV 81.3 81.9  PLT 468* 394   Cardiac Enzymes: No results for input(s): CKTOTAL, CKMB, CKMBINDEX, TROPONINI in the last 168 hours. BNP: BNP (last 3 results) No results for input(s): BNP in the last 8760 hours.  ProBNP (last 3 results) No results for input(s): PROBNP in the last 8760 hours.  CBG: No results for input(s): GLUCAP in the last 168 hours.     SignedRadene Gunning NP [facility] 03/22/2019, 12:41 PM   Addendum:  I have examined the patient and had a long discussion with her. I have reviewed the chart and discussed the plan with Dyanne Carrel, NP.  Fevers resolved. These were likely due to running out of her Prednisone 1 wk ago. She has also been having loose stools and abdominal discomfort which is similar to her Crohn's flares. In the past she has had similar episodes. Her Rheumatologist has re prescribed Prednisone and will follow up with her.   Debbe Odea, MD [facility] Pager: [email or web address]

## 2019-03-22 NOTE — ED Notes (Signed)
PT TRANSFERRED TO HOSPITAL BED.

## 2019-03-22 NOTE — ED Notes (Signed)
IV removed so pt may eat lunch easier. After pt finishes eating, will d/c w/meds brought from pharmacy.

## 2019-03-22 NOTE — ED Notes (Signed)
D/C instructions discussed and given - med from pharmacy given - Prednisone - voiced understanding tapered down then 46m maintenance.

## 2019-03-22 NOTE — ED Notes (Signed)
Patient transported to CT 

## 2019-03-22 NOTE — ED Notes (Addendum)
Sydney Zimmerman, Pt Placement, and [place] secretary aware pt to be d/c'd to home. Pt aware Pharmacist bringing her home meds then may be d/c'd to home.

## 2019-03-22 NOTE — ED Notes (Signed)
Pt arrived to [facility] via w/c. Family member w/pt. Pt noted to be alert, oriented, calm, and cooperative.

## 2019-03-22 NOTE — H&P (Signed)
History and Physical    Sydney Zimmerman DEY:[id number] DOB: 11-22-1995 DOA: 03/21/2019  PCP: Patient, No Pcp Per   Patient coming from: Home   Chief Complaint: Malaise, fevers, chills, sweats  HPI: Sydney Zimmerman is a 23 y.o. female with medical history significant for Crohn's disease and juvenile rheumatoid arthritis, presenting to the emergency department for evaluation of general malaise with fevers.  Patient reports that she developed fevers and chills close to a week ago, and has ongoing fevers, chills, sweats, and general malaise since then.  She had a couple loose stools early in the course but denies any further diarrhea, abdominal pain, or vomiting.  She denies any dysuria.  She denies cough, shortness of breath, rhinorrhea, or sore throat.  Denies neck stiffness or headaches.  She denies any wounds or rash.  No sick contacts or recent travel.  She ran out of her prednisone a few days ago.  Her Crohn's and rheumatoid arthritis have been fairly stable though she has had some general aches for the past few days that she is attributing to the fevers.  ED Course: Upon arrival to the ED, patient is found to be febrile to 39.4 C, saturating well on room air, tachycardic to the 140s, and with stable blood pressure.  EKG features sinus tachycardia with rate 149.  Chest x-rays negative for acute cardiopulmonary disease.  Chemistry panel with mild hypokalemia.  CBC notable for thrombocytosis.  Lactic acid reassuringly normal.  Urinalysis with small hemoglobin.  And CT of the abdomen and pelvis with question of mild urinary bladder wall thickening, but no other acute findings.  COVID-19 is negative.  Blood and urine cultures were collected, 2 L of normal saline was administered, and the patient was treated with 100 mg IV Solu-Cortef, 40 mEq oral potassium, vancomycin, Rocephin, and Flagyl in the ED.  Review of Systems:  All other systems reviewed and apart from HPI, are negative.  Past Medical History:   Diagnosis Date  . Crohn disease ([place]) 03/22/2019    History reviewed. No pertinent surgical history.   has no history on file for tobacco, alcohol, and drug.  Allergies  Allergen Reactions  . Sulfamethoxazole-Trimethoprim Hives and Itching    Patient developed urticarial rash 3-4 days after starting Bactrim   . Ibuprofen   . Sulfamethoxazole Itching  . Adalimumab Rash    History reviewed. No pertinent family history.   Prior to Admission medications   Not on File    Physical Exam: Vitals:   03/22/19 0134 03/22/19 0215 03/22/19 0223 03/22/19 0245  BP: 121/71 113/65 113/65 111/67  Pulse: 100 92 100 93  Resp: 16 14 16 12   Temp:   98.2 F (36.8 C)   TempSrc:   Oral   SpO2: 100% 100% 98% 99%  Weight:      Height:        Constitutional: NAD, calm  Eyes: PERTLA, lids and conjunctivae normal ENMT: Mucous membranes are moist. Posterior pharynx clear of any exudate or lesions.   Neck: normal, supple, no masses, no thyromegaly Respiratory: no wheezing, no crackles. Normal respiratory effort. No accessory muscle use.  Cardiovascular: Rate ~110 and regular. No extremity edema.   Abdomen: No distension, no tenderness, soft. Bowel sounds active.  Musculoskeletal: no clubbing / cyanosis. No joint deformity upper and lower extremities.   Skin: no significant rashes, lesions, ulcers. Warm, dry, well-perfused. Neurologic: No facial asymmetry. Sensation intact. Moving all extremities.   Psychiatric: Alert and oriented x 3. Calm, cooperative.  Labs on Admission: I have personally reviewed following labs and imaging studies  CBC: Recent Labs  Lab 03/21/19 2200  WBC 9.4  NEUTROABS 6.2  HGB 12.3  HCT 38.6  MCV 81.3  PLT 443*   Basic Metabolic Panel: Recent Labs  Lab 03/21/19 2200  NA 135  K 3.3*  CL 101  CO2 23  GLUCOSE 93  BUN 7  CREATININE 0.85  CALCIUM 8.9   GFR: Estimated Creatinine Clearance: 93.1 mL/min (by C-G formula based on SCr of 0.85 mg/dL).  Liver Function Tests: Recent Labs  Lab 03/21/19 2200  AST 18  ALT 18  ALKPHOS 61  BILITOT 0.4  PROT 8.9*  ALBUMIN 3.3*   No results for input(s): LIPASE, AMYLASE in the last 168 hours. No results for input(s): AMMONIA in the last 168 hours. Coagulation Profile: Recent Labs  Lab 03/21/19 2200  INR 1.1   Cardiac Enzymes: No results for input(s): CKTOTAL, CKMB, CKMBINDEX, TROPONINI in the last 168 hours. BNP (last 3 results) No results for input(s): PROBNP in the last 8760 hours. HbA1C: No results for input(s): HGBA1C in the last 72 hours. CBG: No results for input(s): GLUCAP in the last 168 hours. Lipid Profile: No results for input(s): CHOL, HDL, LDLCALC, TRIG, CHOLHDL, LDLDIRECT in the last 72 hours. Thyroid Function Tests: No results for input(s): TSH, T4TOTAL, FREET4, T3FREE, THYROIDAB in the last 72 hours. Anemia Panel: No results for input(s): VITAMINB12, FOLATE, FERRITIN, TIBC, IRON, RETICCTPCT in the last 72 hours. Urine analysis:    Component Value Date/Time   COLORURINE YELLOW 03/21/2019 2158   APPEARANCEUR HAZY (A) 03/21/2019 2158   LABSPEC 1.027 03/21/2019 2158   PHURINE 5.0 03/21/2019 2158   GLUCOSEU NEGATIVE 03/21/2019 2158   HGBUR SMALL (A) 03/21/2019 2158   BILIRUBINUR NEGATIVE 03/21/2019 2158   KETONESUR NEGATIVE 03/21/2019 2158   PROTEINUR NEGATIVE 03/21/2019 2158   NITRITE NEGATIVE 03/21/2019 2158   LEUKOCYTESUR NEGATIVE 03/21/2019 2158   Sepsis Labs: @LABRCNTIP (procalcitonin:4,lacticidven:4) ) Recent Results (from the past 240 hour(s))  Urine culture     Status: None (Preliminary result)   Collection Time: 03/21/19  9:58 PM   Specimen: Urine, Random  Result Value Ref Range Status   Specimen Description URINE, RANDOM  Final   Special Requests   Final    NONE Performed at [facility], [address], [place], [place] [address]    Culture PENDING  Incomplete   Report Status PENDING  Incomplete  SARS Coronavirus 2 [facility] order,  Performed in [facility] hospital lab) Nasopharyngeal Nasopharyngeal Swab     Status: None   Collection Time: 03/22/19 12:09 AM   Specimen: Nasopharyngeal Swab  Result Value Ref Range Status   SARS Coronavirus 2 NEGATIVE NEGATIVE Final    Comment: (NOTE) If result is NEGATIVE SARS-CoV-2 target nucleic acids are NOT DETECTED. The SARS-CoV-2 RNA is generally detectable in upper and lower  respiratory specimens during the acute phase of infection. The lowest  concentration of SARS-CoV-2 viral copies this assay can detect is 250  copies / mL. A negative result does not preclude SARS-CoV-2 infection  and should not be used as the sole basis for treatment or other  patient management decisions.  A negative result may occur with  improper specimen collection / handling, submission of specimen other  than nasopharyngeal swab, presence of viral mutation(s) within the  areas targeted by this assay, and inadequate number of viral copies  (<250 copies / mL). A negative result must be combined with clinical  observations, patient history, and epidemiological information. If result is POSITIVE SARS-CoV-2 target nucleic acids are DETECTED. The SARS-CoV-2 RNA is generally detectable in upper and lower  respiratory specimens dur ing the acute phase of infection.  Positive  results are indicative of active infection with SARS-CoV-2.  Clinical  correlation with patient history and other diagnostic information is  necessary to determine patient infection status.  Positive results do  not rule out bacterial infection or co-infection with other viruses. If result is PRESUMPTIVE POSTIVE SARS-CoV-2 nucleic acids MAY BE PRESENT.   A presumptive positive result was obtained on the submitted specimen  and confirmed on repeat testing.  While 2019 novel coronavirus  (SARS-CoV-2) nucleic acids may be present in the submitted sample  additional confirmatory testing may be necessary for epidemiological  and / or  clinical management purposes  to differentiate between  SARS-CoV-2 and other Sarbecovirus currently known to infect humans.  If clinically indicated additional testing with an alternate test  methodology [phone]) is advised. The SARS-CoV-2 RNA is generally  detectable in upper and lower respiratory sp ecimens during the acute  phase of infection. The expected result is Negative. Fact Sheet for Patients:  StrictlyIdeas.no Fact Sheet for Healthcare Providers: BankingDealers.co.za This test is not yet approved or cleared by the [place] FDA and has been authorized for detection and/or diagnosis of SARS-CoV-2 by FDA under an Emergency Use Authorization (EUA).  This EUA will remain in effect (meaning this test can be used) for the duration of the COVID-19 declaration under Section 564(b)(1) of the Act, 21 U.S.C. section 360bbb-3(b)(1), unless the authorization is terminated or revoked sooner. Performed at [facility], [place] [address] [place], [place] [address]      Radiological Exams on Admission: Ct Abdomen Pelvis W Contrast  Result Date: 03/22/2019 CLINICAL DATA:  Flank pain, fever EXAM: CT ABDOMEN AND PELVIS WITH CONTRAST TECHNIQUE: Multidetector CT imaging of the abdomen and pelvis was performed using the standard protocol following bolus administration of intravenous contrast. CONTRAST:  158m OMNIPAQUE IOHEXOL 300 MG/ML  SOLN COMPARISON:  None. FINDINGS: Lower chest: The visualized heart size within normal limits. No pericardial fluid/thickening. No hiatal hernia. The visualized portions of the lungs are clear. Hepatobiliary: The liver is normal in density without focal abnormality.The main portal vein is patent. No evidence of calcified gallstones, gallbladder wall thickening or biliary dilatation. Pancreas: Unremarkable. No pancreatic ductal dilatation or surrounding inflammatory changes. Spleen: Normal in size without focal  abnormality. Adrenals/Urinary Tract: Both adrenal glands appear normal. The kidneys and collecting system are normal in appearance. No hydronephrosis or perinephric stranding. There does appear to be mild bladder wall thickening with question of surrounding superior bladder fat stranding changes. Stomach/Bowel: The stomach, small bowel, and colon are normal in appearance. No inflammatory changes, wall thickening, or obstructive findings.The appendix is normal. Vascular/Lymphatic: There are no enlarged mesenteric, retroperitoneal, or pelvic lymph nodes. No significant vascular findings are present. Reproductive: The uterus and adnexa are unremarkable. a small amount of free fluid seen within the cul-de-sac. Other: No evidence of abdominal wall mass or hernia. Musculoskeletal: No acute or significant osseous findings. IMPRESSION: Findings which could be suggestive of cystitis. No renal or collecting system calculi. Electronically Signed   By: BPrudencio PairM.D.   On: 03/22/2019 00:53   Dg Chest Portable 1 View  Result Date: 03/21/2019 CLINICAL DATA:  Fever EXAM: PORTABLE CHEST 1 VIEW COMPARISON:  None. FINDINGS: The heart size and mediastinal contours are within normal limits. Both lungs are  clear. The visualized skeletal structures are unremarkable. IMPRESSION: No active disease. Electronically Signed   By: Prudencio Pair M.D.   On: 03/21/2019 23:20    EKG: Independently reviewed. Sinus tachycardia, rate 149.   Assessment/Plan   1. SIRS  - Presents with 1 week of fever/chills and is found to be febrile and tachycardic with source not identified  - She is managed with golimumab for RA and IBD, placing her at increased risk for life-threatening infections  - Blood and urine cultures were collected in ED, 30 cc/kg NS bolus was given, and she was started on broad-spectrum antibiotics empirically  - Continue broad-spectrum antibiotics for now, follow cultures and clinical course, continue supportive care     2. Crohn's disease; juvenal rheumatoid arthritis   - Patient reports these to be stable  - Continue prednisone   3. Hypokalemia  - Replaced, repeat chem panel in am    PPE: mask, face shield  DVT prophylaxis: Lovenox  Code Status: Full  Family Communication: Discussed with patient  Consults called: None  Admission status: Observation     Vianne Bulls, MD [facility] Pager [phone]  If 7PM-7AM, please contact night-coverage [email or web address] Password [email or web address]  03/22/2019, 3:31 AM

## 2019-03-26 LAB — CULTURE, BLOOD (ROUTINE X 2)
Culture: NO GROWTH
Culture: NO GROWTH

## 2019-09-21 ENCOUNTER — Emergency Department (HOSPITAL_COMMUNITY): Payer: BC Managed Care – PPO

## 2019-09-21 ENCOUNTER — Other Ambulatory Visit: Payer: Self-pay

## 2019-09-21 ENCOUNTER — Emergency Department (HOSPITAL_COMMUNITY)
Admission: EM | Admit: 2019-09-21 | Discharge: 2019-09-21 | Disposition: A | Discharge status: Home / Self Care | Payer: BC Managed Care – PPO | Attending: Emergency Medicine | Admitting: Emergency Medicine

## 2019-09-21 ENCOUNTER — Encounter (HOSPITAL_COMMUNITY): Payer: Self-pay | Admitting: Emergency Medicine

## 2019-09-21 DIAGNOSIS — R05 Cough: Secondary | ICD-10-CM | POA: Diagnosis present

## 2019-09-21 DIAGNOSIS — U071 COVID-19: Secondary | ICD-10-CM | POA: Diagnosis not present

## 2019-09-21 DIAGNOSIS — Z79899 Other long term (current) drug therapy: Secondary | ICD-10-CM | POA: Insufficient documentation

## 2019-09-21 HISTORY — DX: Rheumatoid arthritis, unspecified: M06.9

## 2019-09-21 MED ORDER — GUAIFENESIN ER 1200 MG PO TB12: 1.0000 | ORAL_TABLET | Freq: Two times a day (BID) | ORAL | 0 refills | Status: DC

## 2019-09-21 MED ORDER — PROMETHAZINE-DM 6.25-15 MG/5ML PO SYRP: 5.0000 mL | ORAL_SOLUTION | Freq: Four times a day (QID) | ORAL | 0 refills | Status: DC | PRN

## 2019-09-21 NOTE — ED Triage Notes (Signed)
Patient states tested positive for Covid on Friday at [facility]. Arrives today for check up. Speaking in full sentences and in NAD. Denies any pain.

## 2019-09-21 NOTE — Discharge Instructions (Addendum)
Return here for worsening in your condition. Increase your fluid intake.

## 2019-09-21 NOTE — ED Provider Notes (Signed)
[facility] EMERGENCY DEPARTMENT Provider Note   CSN: [id number] Arrival date & time: 09/21/19  1207     History Chief Complaint  Patient presents with  . Follow-up    Covid +    Sydney Zimmerman is a 24 y.o. female.  HPI Patient presents to the emergency department with follow-up following being tested positive for Covid last Friday.  The patient states her symptoms started the Wednesday before this test.  Patient states that nothing seems to make her condition better or worse.  The patient states that she is not had any significant shortness of breath.  The patient states that her vital signs at home as far as temperature have been normal.  Patient states she does not have any other issues other than continued cough.  The patient denies chest pain, shortness of breath, headache,blurred vision, neck pain, fever,  weakness, numbness, dizziness, anorexia, edema, abdominal pain, nausea, vomiting, diarrhea, rash, back pain, dysuria, hematemesis, bloody stool, near syncope, or syncope.    Past Medical History:  Diagnosis Date  . Crohn disease ([place]) 03/22/2019  . Rheumatoid arthritis [facility])     Patient Active Problem List   Diagnosis Date Noted  . SIRS (systemic inflammatory response syndrome) ([place]) 03/22/2019  . Hypokalemia 03/22/2019  . Crohn disease ([place]) 03/22/2019  . Juvenile rheumatoid arthritis ([place]) 03/22/2019    History reviewed. No pertinent surgical history.   OB History   No obstetric history on file.     No family history on file.  Social History   Tobacco Use  . Smoking status: Never Smoker  . Smokeless tobacco: Never Used  Substance Use Topics  . Alcohol use: Yes  . Drug use: Not Currently    Home Medications Prior to Admission medications   Medication Sig Start Date End Date Taking? Authorizing Provider  acetaminophen (TYLENOL) 650 MG CR tablet Take 1,300 mg by mouth every 8 (eight) hours as needed for pain.    [provider]  Golimumab (SIMPONI) 50 MG/0.5ML SOAJ Inject 0.5 mLs into the skin every 28 (twenty-eight) days. 01/07/19   [provider]  nepafenac (NEVANAC) 0.1 % ophthalmic suspension Place 1 drop into the right eye 2 (two) times daily. 10/21/17   [provider]  prednisoLONE acetate (PRED FORTE) 1 % ophthalmic suspension Place 1 drop into the right eye daily.  09/21/18   [provider]  predniSONE (DELTASONE) 10 MG tablet Take take 5 tabs for 2 days starting 10/6 then take 4 tabs for 2 days then take 3 tabs for 2 days then take 2 tabs for 2 days then take 1 tabs for 2 days. 03/22/19   Black, Lezlie Octave, NP  predniSONE (DELTASONE) 5 MG tablet Take 1 tablet (5 mg total) by mouth 2 (two) times daily with a meal. 04/01/19   Black, Lezlie Octave, NP    Allergies    Sulfamethoxazole-trimethoprim, Ibuprofen, Sulfamethoxazole, and Adalimumab  Review of Systems   Review of Systems All other systems negative except as documented in the HPI. All pertinent positives and negatives as reviewed in the HPI. Physical Exam Updated Vital Signs BP (!) 139/92 (BP Location: Left Arm)   Pulse 91   Temp 98.1 F (36.7 C) (Oral)   Resp 17   Ht 5' 3"  (1.6 m)   Wt 64 kg   LMP 09/05/2019   SpO2 98%   BMI 24.98 kg/m   Physical Exam Vitals and nursing note reviewed.  Constitutional:  General: She is not in acute distress.    Appearance: She is well-developed.  HENT:     Head: Normocephalic and atraumatic.  Eyes:     Pupils: Pupils are equal, round, and reactive to light.  Cardiovascular:     Rate and Rhythm: Normal rate and regular rhythm.     Heart sounds: Normal heart sounds. No murmur. No friction rub. No gallop.   Pulmonary:     Effort: Pulmonary effort is normal. No respiratory distress.     Breath sounds: Normal breath sounds. No stridor. No wheezing, rhonchi or rales.  Abdominal:     General: Bowel sounds are normal. There is no distension.     Palpations: Abdomen is soft.      Tenderness: There is no abdominal tenderness.  Musculoskeletal:     Cervical back: Normal range of motion and neck supple.  Skin:    General: Skin is warm and dry.     Capillary Refill: Capillary refill takes less than 2 seconds.     Findings: No erythema or rash.  Neurological:     Mental Status: She is alert and oriented to person, place, and time.     Motor: No abnormal muscle tone.     Coordination: Coordination normal.  Psychiatric:        Behavior: Behavior normal.     ED Results / Procedures / Treatments   Labs (all labs ordered are listed, but only abnormal results are displayed) Labs Reviewed - No data to display  EKG None  Radiology DG Chest [facility] 1 View  Result Date: 09/21/2019 CLINICAL DATA:  Cough when lying down. The patient was diagnosed with COVID-19 1 week ago. EXAM: PORTABLE CHEST 1 VIEW COMPARISON:  Single-view of the chest 03/21/2019. FINDINGS: The lungs are clear. Heart size is normal. No pneumothorax or pleural fluid. No acute or focal bony abnormality. IMPRESSION: Normal chest. Electronically Signed   By: Inge Rise M.D.   On: 09/21/2019 14:35    Procedures Procedures (including critical care time)  Medications Ordered in ED Medications - No data to display  ED Course  I have reviewed the triage vital signs and the nursing notes.  Pertinent labs & imaging results that were available during my care of the patient were reviewed by me and considered in my medical decision making (see chart for details).    MDM Rules/Calculators/A&P                     This point the patient's vital signs remained stable here in the emergency department.  She is in no acute distress she is resting comfortably in the bed without any issues.  The patient is advised to return here for any worsening in her condition.  The patient is also advised to follow-up with her primary doctor.  Told to increase her fluid intake and rest as much as possible.  Patient agrees this  plan and all questions were answered. Final Clinical Impression(s) / ED Diagnoses Final diagnoses:  None    Rx / DC Orders ED Discharge Orders    None       Dalia Heading, Hershal Coria 09/22/19 2156    Blanchie Dessert, MD 09/23/19 [phone]

## 2019-11-10 ENCOUNTER — Ambulatory Visit
Admission: RE | Admit: 2019-11-10 | Discharge: 2019-11-10 | Disposition: A | Discharge status: Home / Self Care | Payer: BC Managed Care – PPO | Source: Ambulatory Visit | Attending: Family Medicine | Admitting: Family Medicine

## 2019-11-10 ENCOUNTER — Other Ambulatory Visit: Payer: Self-pay | Admitting: Family Medicine

## 2019-11-10 DIAGNOSIS — R0601 Orthopnea: Secondary | ICD-10-CM

## 2020-04-10 ENCOUNTER — Encounter: Payer: Self-pay | Admitting: Allergy and Immunology

## 2020-04-10 ENCOUNTER — Other Ambulatory Visit: Payer: Self-pay

## 2020-04-10 ENCOUNTER — Ambulatory Visit (INDEPENDENT_AMBULATORY_CARE_PROVIDER_SITE_OTHER): Payer: BC Managed Care – PPO | Admitting: Allergy and Immunology

## 2020-04-10 VITALS — BP 112/80 | HR 76 | Resp 16 | Ht 63.0 in | Wt 136.4 lb

## 2020-04-10 DIAGNOSIS — J3089 Other allergic rhinitis: Secondary | ICD-10-CM

## 2020-04-10 DIAGNOSIS — H1010 Acute atopic conjunctivitis, unspecified eye: Secondary | ICD-10-CM | POA: Diagnosis not present

## 2020-04-10 DIAGNOSIS — H1013 Acute atopic conjunctivitis, bilateral: Secondary | ICD-10-CM

## 2020-04-10 DIAGNOSIS — J301 Allergic rhinitis due to pollen: Secondary | ICD-10-CM

## 2020-04-10 DIAGNOSIS — L500 Allergic urticaria: Secondary | ICD-10-CM

## 2020-04-10 MED ORDER — MONTELUKAST SODIUM 10 MG PO TABS: ORAL_TABLET | ORAL | 5 refills | Status: DC

## 2020-04-10 NOTE — Progress Notes (Signed)
[place] - [place] - [place] - [place] - [place]   Dear Dr. Ayesha Rumpf,  Thank you for referring Sydney Zimmerman to the [facility] on 04/10/2020.   Below is a summation of this patient's evaluation and recommendations.  Thank you for your referral. I will keep you informed about this patient's response to treatment.   If you have any questions please do not hesitate to contact me.   Sincerely,  Jiles Prows, MD Allergy / Immunology [place]   ______________________________________________________________________    NEW PATIENT NOTE  Referring Provider: Lin Landsman, MD Primary Provider: Lin Landsman, MD Date of office visit: 04/10/2020    Subjective:   Chief Complaint:  Sydney Zimmerman (DOB: [birth year]) is a 24 y.o. female who presents to the clinic on 04/10/2020 with a chief complaint of Allergies (Dog allergy?) .     HPI: Zaida presents to this clinic in evaluation of allergic symptoms.  She has a long history of springtime allergic rhinoconjunctivitis manifested as nasal congestion and sneezing and itchy eyes especially following exposure to pollen that she would treat with antihistamines. She has noticed that over the course of the past several months she has developed an allergy to her dog. Her dog has been inside her household for 6 months. When she has contact with her dog she will develop contact urticaria with red raised itchy lesions on her skin. In addition, she has noticed much more nasal congestion and sneezing and runny nose with dog exposure. This occurs even though she has tried Human resources officer and hydroxyzine, which gave her significant sleepiness, and loratadine. This occurs even though she uses prednisone 5 mg every day and is on immunosuppression with golimumab for her rheumatoid arthritis/Crohn's/uveitis.  She has not had a Covid vaccine or a flu vaccine to date.  She did contract Covid infection in March 2021 manifested as cough and sneezing and anosmia of 2 weeks duration with complete resolution.  Past Medical History:  Diagnosis Date  . Crohn disease ([place]) 03/22/2019  . Rheumatoid arthritis [facility])     Past Surgical History:  Procedure Laterality Date  . CATARACT EXTRACTION Right   . GLAUCOMA SURGERY      Allergies as of 04/10/2020      Reactions   Sulfamethoxazole-trimethoprim Hives, Itching   Patient developed urticarial rash 3-4 days after starting Bactrim   Ibuprofen    Sulfamethoxazole Itching   Adalimumab Rash      Medication List    acetaminophen 650 MG CR tablet Commonly known as: TYLENOL Take 1,300 mg by mouth every 8 (eight) hours as needed for pain.   CALCIUM PO Take by mouth.   nepafenac 0.1 % ophthalmic suspension Commonly known as: [place] 1 drop into the right eye 2 (two) times daily.   prednisoLONE acetate 1 % ophthalmic suspension Commonly known as: PRED FORTE Place 1 drop into the right eye daily.   predniSONE 5 MG tablet Commonly known as: DELTASONE Take 1 tablet (5 mg total) by mouth 2 (two) times daily with a meal.   Simponi 50 MG/0.5ML Soaj Generic drug: Golimumab Inject 0.5 mLs into the skin every 28 (twenty-eight) days.       Review of systems negative except as noted in HPI / PMHx or noted below:  Review of Systems  Constitutional: Negative.   HENT: Negative.   Eyes: Negative.   Respiratory: Negative.   Cardiovascular: Negative.   Gastrointestinal:  Negative.   Genitourinary: Negative.   Musculoskeletal: Negative.   Skin: Negative.   Neurological: Negative.   Endo/Heme/Allergies: Negative.   Psychiatric/Behavioral: Negative.     History reviewed. No pertinent family history.  Social History   Socioeconomic History  . Marital status: Single    Spouse name: Not on file  . Number of children: Not on file  . Years of education: Not on file  . Highest education level: Not on  file  Occupational History  . Not on file  Tobacco Use  . Smoking status: Never Smoker  . Smokeless tobacco: Never Used  Substance and Sexual Activity  . Alcohol use: Yes  . Drug use: Not Currently  . Sexual activity: Not on file  Other Topics Concern  . Not on file  Social History Narrative  . Not on file    Environmental and Social history  Lives in a apartment with a dry environment, a cat and dog located inside the household, carpet in the bedroom, no plastic on the bed, no plastic on the pillow, no smoking ongoing with inside the household. She works as a Buyer, retail in [employer].  Objective:   Vitals:   04/10/20 0948  BP: 112/80  Pulse: 76  Resp: 16  SpO2: 98%   Height: 5' 3"  (160 cm) Weight: 136 lb 6.4 oz (61.9 kg)  Physical Exam Constitutional:      Appearance: She is not diaphoretic.  HENT:     Head: Normocephalic. No right periorbital erythema or left periorbital erythema.     Right Ear: Tympanic membrane, ear canal and external ear normal.     Left Ear: Tympanic membrane, ear canal and external ear normal.     Nose: Nose normal. No mucosal edema or rhinorrhea.     Mouth/Throat:     Pharynx: Uvula midline. No oropharyngeal exudate.  Eyes:     General: Lids are normal.     Conjunctiva/sclera: Conjunctivae normal.     Pupils: Pupils are equal, round, and reactive to light.  Neck:     Thyroid: No thyromegaly.     Trachea: Trachea normal. No tracheal tenderness or tracheal deviation.  Cardiovascular:     Rate and Rhythm: Normal rate and regular rhythm.     Heart sounds: Normal heart sounds, S1 normal and S2 normal. No murmur heard.   Pulmonary:     Effort: Pulmonary effort is normal. No respiratory distress.     Breath sounds: Normal breath sounds. No stridor. No wheezing or rales.  Chest:     Chest wall: No tenderness.  Abdominal:     General: There is no distension.     Palpations: Abdomen is soft. There is no mass.     Tenderness: There is  no abdominal tenderness. There is no guarding or rebound.  Musculoskeletal:        General: No tenderness.  Lymphadenopathy:     Head:     Right side of head: No tonsillar adenopathy.     Left side of head: No tonsillar adenopathy.     Cervical: No cervical adenopathy.  Skin:    Coloration: Skin is not pale.     Findings: No erythema or rash.     Nails: There is no clubbing.  Neurological:     Mental Status: She is alert.      Diagnostics: Allergy skin tests were performed.  She demonstrated hypersensitivity to grass, mold, dust mite, and dog.  Results of blood tests obtained for August 2021  identified WBC 9.7, absolute eosinophil 100, absolute lymphocyte 3200, hemoglobin 12.4, platelet 320  Assessment and Plan:    1. Perennial allergic rhinitis   2. Seasonal allergic rhinitis due to pollen   3. Seasonal allergic conjunctivitis   4. Perennial allergic conjunctivitis of both eyes   5. Allergic urticaria     1. Allergen avoidance measures - dog, dust mite, pollen, mold  2. Treat and prevent inflammation:   A. Montelukast 10 mg - 1 tablet 1 time per day  B. OTC Rhinocort/budesonide -1 spray each nostril 1 time per day  3. If needed:   A. Loratadine 10 mg - 1-2 tablets 1-2 times per day (MAX=61m)  B. OTC Pataday - 1 drop each eye 1 time per day  4. Consider a course of immunotherapy  5. Consider obtaining COVID vaccine and flu vaccine  6. Return to clinic in 4 weeks or earlier if problem  ALaelahas an atopic immune system and she will try her best to perform allergen avoidance measures and we will treat her with a combination of a leukotriene modifier and some nasal steroid utilized in a preventative manner.  She would definitely a candidate for immunotherapy and we given her literature on this form of treatment and she is presently considering this option.  I will see her back in this clinic in 4 weeks or earlier if there is a problem.  EJiles Prows MD Allergy /  Immunology [place] [place]

## 2020-04-10 NOTE — Patient Instructions (Addendum)
  1. Allergen avoidance measures - dog, dust mite, pollen, mold  2. Treat and prevent inflammation:   A. Montelukast 10 mg - 1 tablet 1 time per day  B. OTC Rhinocort/budesonide -1 spray each nostril 1 time per day  3. If needed:   A. Loratadine 10 mg - 1-2 tablets 1-2 times per day (MAX=51m)  B. OTC Pataday - 1 drop each eye 1 time per day  4. Consider a course of immunotherapy  5. Consider obtaining COVID vaccine and flu vaccine  6. Return to clinic in 4 weeks or earlier if problem

## 2020-04-11 ENCOUNTER — Encounter: Payer: Self-pay | Admitting: Allergy and Immunology

## 2020-05-08 ENCOUNTER — Ambulatory Visit: Payer: BC Managed Care – PPO | Admitting: Allergy and Immunology

## 2020-06-12 ENCOUNTER — Ambulatory Visit (INDEPENDENT_AMBULATORY_CARE_PROVIDER_SITE_OTHER): Payer: BC Managed Care – PPO | Admitting: Allergy and Immunology

## 2020-06-12 ENCOUNTER — Encounter: Payer: Self-pay | Admitting: Allergy and Immunology

## 2020-06-12 ENCOUNTER — Other Ambulatory Visit: Payer: Self-pay

## 2020-06-12 VITALS — BP 110/62 | HR 92 | Temp 98.2°F | Resp 16

## 2020-06-12 DIAGNOSIS — D849 Immunodeficiency, unspecified: Secondary | ICD-10-CM

## 2020-06-12 DIAGNOSIS — H1013 Acute atopic conjunctivitis, bilateral: Secondary | ICD-10-CM

## 2020-06-12 DIAGNOSIS — L500 Allergic urticaria: Secondary | ICD-10-CM

## 2020-06-12 DIAGNOSIS — J3089 Other allergic rhinitis: Secondary | ICD-10-CM

## 2020-06-12 DIAGNOSIS — H1010 Acute atopic conjunctivitis, unspecified eye: Secondary | ICD-10-CM

## 2020-06-12 DIAGNOSIS — J301 Allergic rhinitis due to pollen: Secondary | ICD-10-CM | POA: Diagnosis not present

## 2020-06-12 MED ORDER — MONTELUKAST SODIUM 10 MG PO TABS: ORAL_TABLET | ORAL | 5 refills | Status: DC

## 2020-06-12 NOTE — Progress Notes (Signed)
[place] - [place] - [place]   Follow-up Note  Referring Provider: Lin Landsman, MD Primary Provider: Lin Landsman, MD Date of Office Visit: 06/12/2020  Subjective:   Sydney Zimmerman (DOB: 04/19/1996) is a 24 y.o. female who returns to the [facility] on 06/12/2020 in re-evaluation of the following:  HPI: Sydney Zimmerman returns to this clinic in evaluation of allergic rhinoconjunctivitis and urticaria in the context of immunosuppression for Crohn's disease and rheumatoid arthritis.  I last saw her in this clinic during initial evaluation of 10 April 2020.  While utilizing montelukast she has had excellent control of both her urticaria and all of her nasal symptoms.  This improvement occurs also in the context of her performing allergen avoidance measures regarding dog and dust mite.  She has no need to use any nasal steroid at this point.  She is not using an antihistamine.  On occasion she will develop isolated urticarial lesion.about once every week or so.  She will not be receiving the Covid vaccine or the flu vaccine this year.  Allergies as of 06/12/2020      Reactions   Sulfamethoxazole-trimethoprim Hives, Itching   Patient developed urticarial rash 3-4 days after starting Bactrim   Ibuprofen    Sulfamethoxazole Itching   Adalimumab Rash      Medication List      acetaminophen 650 MG CR tablet Commonly known as: TYLENOL Take 1,300 mg by mouth every 8 (eight) hours as needed for pain.   CALCIUM PO Take by mouth.   montelukast 10 MG tablet Commonly known as: Singulair Take one tablet once daily to prevent as directed   nepafenac 0.1 % ophthalmic suspension Commonly known as: NEVANAC Place 1 drop into the right eye 2 (two) times daily.   prednisoLONE acetate 1 % ophthalmic suspension Commonly known as: PRED FORTE Place 1 drop into the right eye daily.   predniSONE 5 MG tablet Commonly known as: DELTASONE Take 1 tablet  (5 mg total) by mouth 2 (two) times daily with a meal.   Simponi 50 MG/0.5ML Soaj Generic drug: Golimumab Inject 0.5 mLs into the skin every 28 (twenty-eight) days.       Past Medical History:  Diagnosis Date  . Crohn disease ([place]) 03/22/2019  . Rheumatoid arthritis [facility])     Past Surgical History:  Procedure Laterality Date  . CATARACT EXTRACTION Right   . GLAUCOMA SURGERY      Review of systems negative except as noted in HPI / PMHx or noted below:  Review of Systems  Constitutional: Negative.   HENT: Negative.   Eyes: Negative.   Respiratory: Negative.   Cardiovascular: Negative.   Gastrointestinal: Negative.   Genitourinary: Negative.   Musculoskeletal: Negative.   Skin: Negative.   Neurological: Negative.   Endo/Heme/Allergies: Negative.   Psychiatric/Behavioral: Negative.      Objective:   Vitals:   06/12/20 1606  BP: 110/62  Pulse: 92  Resp: 16  Temp: 98.2 F (36.8 C)  SpO2: 97%          Physical Exam Constitutional:      Appearance: She is not diaphoretic.  HENT:     Head: Normocephalic.     Right Ear: Tympanic membrane, ear canal and external ear normal.     Left Ear: Tympanic membrane, ear canal and external ear normal.     Nose: Nose normal. No mucosal edema or rhinorrhea.     Mouth/Throat:     Mouth: Oropharynx  is clear and moist and mucous membranes are normal.     Pharynx: Uvula midline. No oropharyngeal exudate.  Eyes:     Conjunctiva/sclera: Conjunctivae normal.  Neck:     Thyroid: No thyromegaly.     Trachea: Trachea normal. No tracheal tenderness or tracheal deviation.  Cardiovascular:     Rate and Rhythm: Normal rate and regular rhythm.     Heart sounds: Normal heart sounds, S1 normal and S2 normal. No murmur heard.   Pulmonary:     Effort: No respiratory distress.     Breath sounds: Normal breath sounds. No stridor. No wheezing or rales.  Musculoskeletal:        General: No edema.  Lymphadenopathy:     Head:     Right  side of head: No tonsillar adenopathy.     Left side of head: No tonsillar adenopathy.     Cervical: No cervical adenopathy.  Skin:    Findings: No erythema or rash.     Nails: There is no clubbing.  Neurological:     Mental Status: She is alert.     Diagnostics: none  Assessment and Plan:   1. Perennial allergic rhinitis   2. Seasonal allergic rhinitis due to pollen   3. Seasonal allergic conjunctivitis   4. Perennial allergic conjunctivitis of both eyes   5. Allergic urticaria   6. Immunosuppression ([place])     1.  Continue to perform allergen avoidance measures - dog, dust mite, pollen, mold  2.  Continue to treat and prevent inflammation:   A. Montelukast 10 mg - 1 tablet 1 time per day  3. If needed:   A. Loratadine 10 mg - 1-2 tablets 1-2 times per day (MAX=45m)  B. OTC Pataday - 1 drop each eye 1 time per day  C. OTC Rhinocort/budesonide -1 spray each nostril 1 time per day  4. Return to clinic in 1 year or earlier if problem  APhilippais really doing very well and I think that attention to allergen avoidance measures has probably resulted in very good control of her immune activation manifested as rhinitis and conjunctivitis and urticaria.  She will continue on montelukast as well and she has the option of utilizing other medications should they be required.  I will see her back in this clinic in approximately 1 year or earlier if there is a problem.  EAllena Katz MD Allergy / Immunology [place]

## 2020-06-12 NOTE — Patient Instructions (Addendum)
°  1.  Continue to perform allergen avoidance measures - dog, dust mite, pollen, mold  2.  Continue to treat and prevent inflammation:   A. Montelukast 10 mg - 1 tablet 1 time per day  3. If needed:   A. Loratadine 10 mg - 1-2 tablets 1-2 times per day (MAX=24m)  B. OTC Pataday - 1 drop each eye 1 time per day  C. OTC Rhinocort/budesonide -1 spray each nostril 1 time per day  4. Return to clinic in 1 year or earlier if problem

## 2020-06-13 ENCOUNTER — Encounter: Payer: Self-pay | Admitting: Allergy and Immunology

## 2021-02-19 ENCOUNTER — Encounter: Payer: Self-pay | Admitting: Gastroenterology

## 2021-03-14 ENCOUNTER — Other Ambulatory Visit (INDEPENDENT_AMBULATORY_CARE_PROVIDER_SITE_OTHER): Payer: BC Managed Care – PPO

## 2021-03-14 ENCOUNTER — Encounter: Payer: Self-pay | Admitting: Gastroenterology

## 2021-03-14 ENCOUNTER — Ambulatory Visit (INDEPENDENT_AMBULATORY_CARE_PROVIDER_SITE_OTHER): Payer: BC Managed Care – PPO | Admitting: Gastroenterology

## 2021-03-14 VITALS — BP 112/60 | HR 65 | Ht 63.0 in | Wt 158.0 lb

## 2021-03-14 DIAGNOSIS — K50919 Crohn's disease, unspecified, with unspecified complications: Secondary | ICD-10-CM | POA: Diagnosis not present

## 2021-03-14 DIAGNOSIS — K589 Irritable bowel syndrome without diarrhea: Secondary | ICD-10-CM

## 2021-03-14 DIAGNOSIS — K582 Mixed irritable bowel syndrome: Secondary | ICD-10-CM | POA: Diagnosis not present

## 2021-03-14 LAB — HEPATIC FUNCTION PANEL
ALT: 12 U/L (ref 0–35)
AST: 10 U/L (ref 0–37)
Albumin: 3.9 g/dL (ref 3.5–5.2)
Alkaline Phosphatase: 41 U/L (ref 39–117)
Bilirubin, Direct: 0 mg/dL (ref 0.0–0.3)
Total Bilirubin: 0.2 mg/dL (ref 0.2–1.2)
Total Protein: 7.3 g/dL (ref 6.0–8.3)

## 2021-03-14 LAB — LIPASE: Lipase: 16 U/L (ref 11.0–59.0)

## 2021-03-14 MED ORDER — DICYCLOMINE HCL 20 MG PO TABS: 20.0000 mg | ORAL_TABLET | Freq: Four times a day (QID) | ORAL | 1 refills | Status: DC

## 2021-03-14 NOTE — Progress Notes (Signed)
HPI : Sydney Zimmerman is a very pleasant 25 year old female with a history of juvenile RA and uveitis and IBS who is referred to [place] by Sydney Zimmerman for further management and to establish care.  She was previously followed by [facility] and then briefly with adult GI at [facility].  She states that she has had problems with her GI tract most of her life.  She was thought to have Crohn's disease for several years based on a colonoscopy in 2013 with biopsies showing granulomas, but at her last GI visit in 2018, she was re-classified as IBS based on repeat endoscopy in 2018 which was completely normal in the setting of significant abdominal pain and diarrhea.   She has been on numerous biologic medications (Humira, Remicade, Cimzia, Tocilizumab, Methotrexate, cellcept)for her RA due to intolerance, side effects, insurance reasons or lack of efficacy (please see medication summary borrowed from Sydney Zimmerman note below)   Currently, the patient is on Simponi (started earlier this year by her rheumatologist) and prednisone (10 mg).  She also was just restarted on methotrexate last week (had been discontinued in 2017 due to elevated liver enzymes). She has not been able to completely wean off steroids in over a 'long time' She reports ongoing symptoms of pain in her bilateral knees, elbow, ankles and sometimes her back. Her GI symptoms have waxed and waned over the years, but they have been fairly bothersome for the past 3 years and constant for the past few months.  Her symptoms historically consist of diarrhea, abdominal pain and constipation.  Lately, diarrhea has been her predominant symptoms.  She has been having about 5 bowel movements/day.  Stools are nonbloody although she will sometimes see blood on the toilet paper.  She has crampy abdominal pain which can be severe, and is usually improved with bowel movements.  She also reports early satiety, heartburn and acid regurgitation.  GERD  symptoms occur several days a week, typically worse in the evenings/bedtime. She takes TUMS as needed which does help.  Her weight has increased (20lbs in the past year).  She takes Tylenol for additional pain control which does not seem to help.  She does not take NSAIDs. She avoids beef and pork, but otherwise follows a regular diet without any specific dietary restrictions.     Summary of medical therapy from Sydney Zimmerman in May 2018 "Medical Overview: [name] was initially seen by Sydney Zimmerman and had an EGD/Colon done in 2006. She was followed by Rheumatologist for JRA and Uveitis. Remicade (psoriasis), Humira (rash, allergic reaction) were changed to Cimzia in 2015 without improvement of symptoms. She was having persistent Uveitis, generalized abdominal pain, diarrhea and weight loss. Tocilizumab (anti-IL6) was started in January 2016 with marked improvement of symptoms. EGD/Colon on 11-27-14 to evaluate for disease activity showed focal active gastritis, IEL in the duodenum, focal granulomatous changes in the rectum/sigmoid colon. Perianal tags was noticed for which protopic cream ws prescribed with improvement of bleeding and pain. She is clinically doing well without any symptoms. Symptoms resolved symptoms after IV Tocilizumab (Anti-IL-6 Receptor) with further increase in dose (December 2016) from 8 to 12 mg/kg every 2 weeks. Cellcept was discontinued secondary to GI side effects and [name] 25 mg Methotrexate was started in April 2017. Following medications were discontinued (remicade psoriasis, Humira allergic reaction, cellcept poorly tolerated, did well with Tocilizumab (chemical hepatitis), Cimiza (insurance issues)."  Taken from Assessment Sydney Zimmerman, Nov 2018 'Patient was sent here with alternating  diarrhea and constipation with occasional hematochezia. This is an ongoing problem that dates back to 2004. Reviewing all the biopsies and colonoscopies done on this patient in 2004 2006 2016  and 2018 it is hard for me to make a diagnosis of Crohn's disease. Colonoscopies are normal and in 2016 normal colonoscopy random biopsies revealed the following pathology:Comment: in the sigmoid colon and rectum, focal granulomatous reaction is seen beneath the surface epithelium. This may represent granulomatous reaction associated with crypt damage (mucin granuloma) or true granulomata. Well-formed granulomata are not identified. There is no evidence of dysplasia in the reviewed biopsies. Again this year in August colonoscopy and upper endoscopy were normal with normal biopsies. This includes the terminal ileum. 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 They were normal. Inflammatory markers were normal. Patient Healthy & examination was normal. I spent time to explained to her of how to manage chronic constipation to prevent episodes of diarrhea. This involves fluid and fiber the use of laxatives to make sure that he has a good bowel movement every other day. She prefers MiraLAX which is fine. The explanation been given to patient both in writing. We will see him back in 6 weeks and see how he is doing"     Past Medical History:  Diagnosis Date   Crohn disease ([place]) 03/22/2019   Rheumatoid arthritis ([place])      Past Surgical History:  Procedure Laterality Date   CATARACT EXTRACTION Right    COLONOSCOPY     GLAUCOMA SURGERY     Family History  Problem Relation Age of Onset   Colon polyps Mother    Colon polyps Maternal Grandmother    Esophageal cancer Maternal Grandmother    Diabetes Maternal Grandmother    Social History   Tobacco Use   Smoking status: Never   Smokeless tobacco: Never  Substance Use Topics   Alcohol use: Yes   Drug use: Not Currently   Current Outpatient Medications  Medication Sig Dispense Refill   acetaminophen  (TYLENOL) 650 MG CR tablet Take 1,300 mg by mouth every 8 (eight) hours as needed for pain.     Golimumab (SIMPONI) 50 MG/0.5ML SOAJ Inject 0.5 mLs into the skin every 28 (twenty-eight) days.     Methotrexate, PF, 25 MG/0.4ML SOAJ Inject into the skin. Once weekly     montelukast (SINGULAIR) 10 MG tablet Take one tablet once daily to prevent as directed 30 tablet 5   nepafenac (NEVANAC) 0.1 % ophthalmic suspension Place 1 drop into the right eye 2 (two) times daily.     prednisoLONE acetate (PRED FORTE) 1 % ophthalmic suspension 1 drop 4 (four) times daily.     predniSONE (DELTASONE) 5 MG tablet Take 1 tablet (5 mg total) by mouth 2 (two) times daily with a meal. (Patient taking differently: Take 5 mg by mouth 2 (two) times daily with a meal. Currently on 12m daily- taper as directed.) 30 tablet 0   No current facility-administered medications for this visit.   Allergies  Allergen Reactions   Sulfamethoxazole-Trimethoprim Hives and Itching    Patient developed urticarial rash 3-4 days after starting Bactrim    Ibuprofen    Sulfamethoxazole Itching   Adalimumab Rash     Review of Systems: All systems reviewed and negative except where noted in HPI.    No results found.  Physical Exam: BP 112/60  Pulse 65   Ht 5' 3"  (1.6 m)   Wt 158 lb (71.7 kg)   SpO2 99%   BMI 27.99 kg/m  Constitutional: Pleasant,well-developed, African American female in no acute distress.  Her mother was listening via telephone during the encounter and provided history and asked questions during the encounter HEENT: Normocephalic and atraumatic. Conjunctivae are normal. No scleral icterus. Irregularity of right pupil noted, opacification of right cornea Neck supple.  Cardiovascular: Normal rate, regular rhythm.  Pulmonary/chest: Effort normal and breath sounds normal. No wheezing, rales or rhonchi. Abdominal: Soft, nondistended, mild tenderness to palpation in the LLQ, no rigidity or guarding. Bowel sounds  active throughout. There are no masses palpable. No hepatomegaly. Extremities: no edema Neurological: Alert and oriented to person place and time. Skin: Skin is warm and dry. No rashes noted. Psychiatric: Normal mood and affect. Behavior is normal.  CBC    Component Value Date/Time   WBC 6.8 03/22/2019 0744   RBC 4.08 03/22/2019 0744   HGB 10.9 (L) 03/22/2019 0744   HCT 33.4 (L) 03/22/2019 0744   PLT 394 03/22/2019 0744   MCV 81.9 03/22/2019 0744   MCH 26.7 03/22/2019 0744   MCHC 32.6 03/22/2019 0744   RDW 15.6 (H) 03/22/2019 0744   LYMPHSABS 0.8 03/22/2019 0744   MONOABS 0.5 03/22/2019 0744   EOSABS 0.0 03/22/2019 0744   BASOSABS 0.0 03/22/2019 0744    CMP     Component Value Date/Time   NA 138 03/22/2019 0744   K 3.8 03/22/2019 0744   CL 108 03/22/2019 0744   CO2 21 (L) 03/22/2019 0744   GLUCOSE 106 (H) 03/22/2019 0744   BUN 6 03/22/2019 0744   CREATININE 0.62 03/22/2019 0744   CALCIUM 8.2 (L) 03/22/2019 0744   PROT 8.9 (H) 03/21/2019 2200   ALBUMIN 3.3 (L) 03/21/2019 2200   AST 18 03/21/2019 2200   ALT 18 03/21/2019 2200   ALKPHOS 61 03/21/2019 2200   BILITOT 0.4 03/21/2019 2200   GFRNONAA >60 03/22/2019 0744   GFRAA >60 03/22/2019 0744     ASSESSMENT AND PLAN: 25 year old female with RA and uveitis, previously diagnosed with Crohn's disease, reclassified to IBS in 2018, currently with ongoing symptoms of abdominal pain and diarrhea without hematochezia or weight loss.  The patient and her mother did not seem to be aware of the diagnosis of IBS and were still under the impression she had Crohn's disease.  We discussed the proposed pathophysiology of IBS and gut brain axis disorders in general.  We briefly discussed management of IBS, to include use of medications to improve bowel habits, as needed pain medicine, centrally acting neuromodulators, role of empiric dietary modifications to include a low FODMAP.  We discussed the goals of IBS management, namely to  minimize the impact of GI symptoms on quality of life.   The patient and her mother were concerned that her symptoms were slightly different than in the past and were concerned that there may be some other process at play.   Will recheck a fecal calprotectin (was previously normal in 2018), liver enzymes, lipase and H. Pylori. I recommended she start taking Metamucil on a daily basis to improve her stool bulk/consistency, and try Bentyl as needed for abdominal pain.   IBS-M, previous diagnosi of Crohn's - Fecal calprotectin - H pylori - Lipase - Hepatic panel - Metamucil daily - Bentyl 20 mg PO PRN q6hrs   I spent a total of 65 minutes reviewing the patient's medical record, interviewing  and examining the patient, discussing her diagnosis and management of her condition going forward, and documenting in the medical record  Dung Salinger E. Candis Schatz, MD [facility]    CC: Lin Landsman, MD

## 2021-03-14 NOTE — Patient Instructions (Signed)
If you are age 24 or older, your body mass index should be between 23-30. Your Body mass index is 27.99 kg/m. If this is out of the aforementioned range listed, please consider follow up with your Primary Care Provider.  If you are age 55 or younger, your body mass index should be between 19-25. Your Body mass index is 27.99 kg/m. If this is out of the aformentioned range listed, please consider follow up with your Primary Care Provider.  Please purchase Metamucil over the counter. Take as directed.    Your provider has requested that you go to the basement level for lab work before leaving today. Press "B" on the elevator. The lab is located at the first door on the left as you exit the elevator.  We have sent the following medications to your pharmacy for you to pick up at your convenience:Bentyl 20  mg   The [place] [facility] providers would like to encourage you to use [facility] to communicate with providers for non-urgent requests or questions.  Due to long hold times on the telephone, sending your provider a message by [facility] may be a faster and more efficient way to get a response.  Please allow 48 business hours for a response.  Please remember that this is for non-urgent requests.   It was a pleasure to see you today!  Thank you for trusting me with your gastrointestinal care!    Scott E. Candis Schatz, MD

## 2021-03-15 ENCOUNTER — Encounter: Payer: Self-pay | Admitting: Gastroenterology

## 2021-03-20 NOTE — Progress Notes (Signed)
Sydney Zimmerman,  Your liver and pancreas tests were normal.  Still awaiting the fecal calprotectin results, but this is supportive of a diagnosis of IBS.  Please follow up with me in clinic as needed to further discuss how to best manage your symptoms.

## 2021-03-22 LAB — CALPROTECTIN, FECAL: Calprotectin, Fecal: 32 ug/g (ref 0–120)

## 2021-04-10 ENCOUNTER — Other Ambulatory Visit: Payer: Self-pay | Admitting: Gastroenterology

## 2021-05-06 ENCOUNTER — Other Ambulatory Visit: Payer: Self-pay | Admitting: Gastroenterology

## 2021-05-13 ENCOUNTER — Ambulatory Visit (INDEPENDENT_AMBULATORY_CARE_PROVIDER_SITE_OTHER): Payer: BC Managed Care – PPO | Admitting: Gastroenterology

## 2021-05-13 ENCOUNTER — Encounter: Payer: Self-pay | Admitting: Gastroenterology

## 2021-05-13 VITALS — BP 110/78 | HR 88 | Ht 63.0 in | Wt 155.0 lb

## 2021-05-13 DIAGNOSIS — K589 Irritable bowel syndrome without diarrhea: Secondary | ICD-10-CM | POA: Insufficient documentation

## 2021-05-13 HISTORY — DX: Irritable bowel syndrome, unspecified: K58.9

## 2021-05-13 NOTE — Progress Notes (Signed)
HPI : Sydney Zimmerman is a very pleasant 25 year old female with a history of rheumatoid arthritis and iritis, previously had been diagnosed with Crohn's disease, but subsequently rediagnosed as IBS, who presents for follow-up after her initial visit with me in September.  At that time, the diagnosis of IBS was further clarified with the patient, and she was recommended to start taking Metamucil daily to improve her stool bulk, and take Bentyl as needed for abdominal pain.  Fecal calprotectin was also performed at that time which was completely normal. Today, the patient reports that she is feeling much better.  Her bowel frequency has decreased from 5 to 6/day down to 3/day on average.  Her abdominal pain is also much better, although she does continue to have fairly regular mild pain in the evenings after dinner.  She has not been taking the Bentyl, because she was not certain of what it was for.  Her appetite is much better and her weight has been stable overall.  She is not seeing any blood recently.  She has been taking the Metamucil every other day on average.    Past Medical History:  Diagnosis Date   Crohn disease ([place]) 03/22/2019   Rheumatoid arthritis ([place])   IBS    Past Surgical History:  Procedure Laterality Date   CATARACT EXTRACTION Right    COLONOSCOPY     GLAUCOMA SURGERY     Family History  Problem Relation Age of Onset   Colon polyps Mother    Colon polyps Maternal Grandmother    Esophageal cancer Maternal Grandmother    Diabetes Maternal Grandmother    Social History   Tobacco Use   Smoking status: Never   Smokeless tobacco: Never  Substance Use Topics   Alcohol use: Yes   Drug use: Not Currently   Current Outpatient Medications  Medication Sig Dispense Refill   acetaminophen (TYLENOL) 650 MG CR tablet Take 1,300 mg by mouth every 8 (eight) hours as needed for pain.     dicyclomine (BENTYL) 20 MG tablet TAKE 1 TABLET BY MOUTH EVERY 6 HOURS 30 tablet 1    Golimumab (SIMPONI) 50 MG/0.5ML SOAJ Inject 0.5 mLs into the skin every 28 (twenty-eight) days.     montelukast (SINGULAIR) 10 MG tablet Take one tablet once daily to prevent as directed 30 tablet 5   nepafenac (NEVANAC) 0.1 % ophthalmic suspension Place 1 drop into the right eye every other day.     prednisoLONE acetate (PRED FORTE) 1 % ophthalmic suspension 1 drop every other day.     predniSONE (DELTASONE) 5 MG tablet Take 1 tablet (5 mg total) by mouth 2 (two) times daily with a meal. (Patient taking differently: Take 5 mg by mouth 2 (two) times daily with a meal. Currently on 1m daily- taper as directed.) 30 tablet 0   No current facility-administered medications for this visit.   Allergies  Allergen Reactions   Sulfamethoxazole-Trimethoprim Hives and Itching    Patient developed urticarial rash 3-4 days after starting Bactrim    Ibuprofen    Sulfamethoxazole Itching   Adalimumab Rash     Review of Systems: All systems reviewed and negative except where noted in HPI.    No results found.  Physical Exam: BP 110/78   Pulse 88   Ht 5' 3"  (1.6 m)   Wt 155 lb (70.3 kg)   BMI 27.46 kg/m  Constitutional: Pleasant,well-developed, African-American female in no acute distress.  Accompanied by fianc HEENT: Normocephalic and atraumatic. Conjunctivae are  normal. No scleral icterus. Cardiovascular: Normal rate, regular rhythm.  Pulmonary/chest: Effort normal and breath sounds normal. No wheezing, rales or rhonchi. Abdominal: Soft, nondistended, nontender. Bowel sounds active throughout. There are no masses palpable. No hepatomegaly. Extremities: no edema Neurological: Alert and oriented to person place and time. Skin: Skin is warm and dry. No rashes noted. Psychiatric: Normal mood and affect. Behavior is normal.  CBC    Component Value Date/Time   WBC 6.8 03/22/2019 0744   RBC 4.08 03/22/2019 0744   HGB 10.9 (L) 03/22/2019 0744   HCT 33.4 (L) 03/22/2019 0744   PLT 394  03/22/2019 0744   MCV 81.9 03/22/2019 0744   MCH 26.7 03/22/2019 0744   MCHC 32.6 03/22/2019 0744   RDW 15.6 (H) 03/22/2019 0744   LYMPHSABS 0.8 03/22/2019 0744   MONOABS 0.5 03/22/2019 0744   EOSABS 0.0 03/22/2019 0744   BASOSABS 0.0 03/22/2019 0744    CMP     Component Value Date/Time   NA 138 03/22/2019 0744   K 3.8 03/22/2019 0744   CL 108 03/22/2019 0744   CO2 21 (L) 03/22/2019 0744   GLUCOSE 106 (H) 03/22/2019 0744   BUN 6 03/22/2019 0744   CREATININE 0.62 03/22/2019 0744   CALCIUM 8.2 (L) 03/22/2019 0744   PROT 7.3 03/14/2021 1007   ALBUMIN 3.9 03/14/2021 1007   AST 10 03/14/2021 1007   ALT 12 03/14/2021 1007   ALKPHOS 41 03/14/2021 1007   BILITOT 0.2 03/14/2021 1007   GFRNONAA >60 03/22/2019 0744   GFRAA >60 03/22/2019 0744     ASSESSMENT AND PLAN: 25 year old female with IBS-D, currently doing well with metamucil and PRN bentyl.  Following a regular diet.  We again discussed the chronic nature of IBS and to expect symptoms to return at some point, especially during periods of stress or change.  For now, the patient is satisfied with her symptom control with her current regimen, and I don't recommend any changes other than adjusting the timing of her Bentyl to taking it before dinner, given that she seems to reliably have some abdominal pain in the evenings after dinner.  IBS - Continue Metamucil - Use loperamide as needed for situations where bathroom access not reliable/convenient - Continue Bentyl, try taking scheduled in the evenings, and then as needed during the morning/day  Emerald Gehres E. Candis Schatz, MD [facility]  CC:  Lin Landsman, MD

## 2021-05-13 NOTE — Patient Instructions (Addendum)
If you are age 24 or older, your body mass index should be between 23-30. Your Body mass index is 27.46 kg/m. If this is out of the aforementioned range listed, please consider follow up with your Primary Care Provider.  If you are age 15 or younger, your body mass index should be between 19-25. Your Body mass index is 27.46 kg/m. If this is out of the aformentioned range listed, please consider follow up with your Primary Care Provider.   Follow up as need.   Continue Bentyl and metamucil.  The [place] [facility] providers would like to encourage you to use [facility] to communicate with providers for non-urgent requests or questions.  Due to long hold times on the telephone, sending your provider a message by [facility] may be a faster and more efficient way to get a response.  Please allow 48 business hours for a response.  Please remember that this is for non-urgent requests.   It was a pleasure to see you today!  Thank you for trusting me with your gastrointestinal care!    Scott E.Candis Schatz, MD

## 2021-06-04 ENCOUNTER — Other Ambulatory Visit: Payer: Self-pay

## 2021-06-04 ENCOUNTER — Ambulatory Visit (INDEPENDENT_AMBULATORY_CARE_PROVIDER_SITE_OTHER): Payer: BC Managed Care – PPO | Admitting: Allergy and Immunology

## 2021-06-04 ENCOUNTER — Encounter: Payer: Self-pay | Admitting: Allergy and Immunology

## 2021-06-04 VITALS — BP 112/68 | HR 79 | Temp 98.4°F | Resp 17

## 2021-06-04 DIAGNOSIS — J3089 Other allergic rhinitis: Secondary | ICD-10-CM | POA: Diagnosis not present

## 2021-06-04 DIAGNOSIS — J301 Allergic rhinitis due to pollen: Secondary | ICD-10-CM | POA: Diagnosis not present

## 2021-06-04 DIAGNOSIS — H1013 Acute atopic conjunctivitis, bilateral: Secondary | ICD-10-CM

## 2021-06-04 DIAGNOSIS — L500 Allergic urticaria: Secondary | ICD-10-CM

## 2021-06-04 DIAGNOSIS — D849 Immunodeficiency, unspecified: Secondary | ICD-10-CM

## 2021-06-04 DIAGNOSIS — H1010 Acute atopic conjunctivitis, unspecified eye: Secondary | ICD-10-CM

## 2021-06-04 MED ORDER — MONTELUKAST SODIUM 10 MG PO TABS: ORAL_TABLET | ORAL | 5 refills | Status: DC

## 2021-06-04 NOTE — Progress Notes (Signed)
[facility] [place] [facility]   Follow-up Note  Referring Provider: Lin Landsman, MD Primary Provider: Lin Landsman, MD Date of Office Visit: 06/04/2021  Subjective:   Sydney Zimmerman (DOB: [birth year]) is a 25 y.o. female who returns to [facility] on 06/04/2021 in re-evaluation of the following:  HPI: Nusrat returns to this clinic in evaluation of allergic rhinoconjunctivitis and urticaria.  I last saw in this clinic on 12 June 2020.  Overall she has done relatively well with her nose and eyes.  She continues on montelukast which has resulted in significant improvement regarding this atopic condition.  She still uses some Pataday.  She does have some itchy nose and eyes on occasion.  She does not use any nasal steroid.  If she touches her dog she will notice that she will get a red blotchy area.  She usually just washes her hands and that is the end of that reaction.  It occurs about twice a week.  She has not had to use any antihistamines to treat urticaria.  Her rheumatoid arthritis is active as is her uveitis as is her Crohn's disease in the context of using an anti-TNF agent and daily prednisone.  She is being referred back to [facility] rheumatology department by her rheumatologist.  Allergies as of 06/04/2021       Reactions   Sulfamethoxazole-trimethoprim Hives, Itching   Patient developed urticarial rash 3-4 days after starting Bactrim   Ibuprofen    Sulfamethoxazole Itching   Adalimumab Rash        Medication List    acetaminophen 650 MG CR tablet Commonly known as: TYLENOL Take 1,300 mg by mouth every 8 (eight) hours as needed for pain.   dicyclomine 20 MG tablet Commonly known as: BENTYL TAKE 1 TABLET BY MOUTH EVERY 6 HOURS   ketorolac 0.4 % Soln Commonly known as: ACULAR Place 1 drop into the right eye 4 (four) times daily.   montelukast 10 MG tablet Commonly known as: Singulair Take one tablet once  daily to prevent as directed   nepafenac 0.1 % ophthalmic suspension Commonly known as: [place] 1 drop into the right eye every other day.   prednisoLONE acetate 1 % ophthalmic suspension Commonly known as: PRED FORTE 1 drop every other day.   predniSONE 5 MG tablet Commonly known as: DELTASONE Take 1 tablet (5 mg total) by mouth 2 (two) times daily with a meal. What changed: additional instructions   Simponi 50 MG/0.5ML Soaj Generic drug: Golimumab Inject 0.5 mLs into the skin every 28 (twenty-eight) days.     Past Medical History:  Diagnosis Date   Crohn disease ([place]) 03/22/2019   IBS (irritable bowel syndrome) 05/13/2021   Rheumatoid arthritis ([place])     Past Surgical History:  Procedure Laterality Date   CATARACT EXTRACTION Right    COLONOSCOPY     GLAUCOMA SURGERY      Review of systems negative except as noted in HPI / PMHx or noted below:  Review of Systems  Constitutional: Negative.   HENT: Negative.    Eyes: Negative.   Respiratory: Negative.    Cardiovascular: Negative.   Gastrointestinal: Negative.   Genitourinary: Negative.   Musculoskeletal: Negative.   Skin: Negative.   Neurological: Negative.   Endo/Heme/Allergies: Negative.   Psychiatric/Behavioral: Negative.      Objective:   Vitals:   06/04/21 1604  BP: 112/68  Pulse: 79  Resp: 17  Temp: 98.4 F (36.9  C)  SpO2: 98%          Physical Exam Constitutional:      Appearance: She is not diaphoretic.  HENT:     Head: Normocephalic.     Right Ear: Tympanic membrane, ear canal and external ear normal.     Left Ear: Tympanic membrane, ear canal and external ear normal.     Nose: Nose normal. No mucosal edema or rhinorrhea.     Mouth/Throat:     Pharynx: Uvula midline. No oropharyngeal exudate.  Eyes:     Conjunctiva/sclera: Conjunctivae normal.  Neck:     Thyroid: No thyromegaly.     Trachea: Trachea normal. No tracheal tenderness or tracheal deviation.  Cardiovascular:      Rate and Rhythm: Normal rate and regular rhythm.     Heart sounds: Normal heart sounds, S1 normal and S2 normal. No murmur heard. Pulmonary:     Effort: No respiratory distress.     Breath sounds: Normal breath sounds. No stridor. No wheezing or rales.  Lymphadenopathy:     Head:     Right side of head: No tonsillar adenopathy.     Left side of head: No tonsillar adenopathy.     Cervical: No cervical adenopathy.  Skin:    Findings: No erythema or rash.     Nails: There is no clubbing.  Neurological:     Mental Status: She is alert.    Diagnostics: none  Assessment and Plan:   1. Perennial allergic rhinitis   2. Seasonal allergic rhinitis due to pollen   3. Seasonal allergic conjunctivitis   4. Perennial allergic conjunctivitis of both eyes   5. Allergic urticaria   6. Immunosuppression ([place])     1.  Continue to perform allergen avoidance measures - dog, dust mite, pollen, mold  2.  Continue to treat and prevent inflammation:   A. Montelukast 10 mg - 1 tablet 1 time per day  3. If needed:   A. OTC Pataday - 1 drop each eye 1 time per day  C. OTC antihistamine  4. Return to clinic in 1 year or earlier if problem  5. Anti-CD20 antibody for autoimmune disease???  6.  Paxlovid for Covid and Tamiflu for influenza infection  Caylei has pretty good control of her atopic respiratory and conjunctival and cutaneous disease and we are not really going to change any of her therapy at this point in time.  She has very significant autoimmune disease in the context of her rheumatoid arthritis and uveitis and Crohn's disease and has failed multiple immunosuppressive agents in the past including MTX, Remicade, Orencia, Actemra, Enbrel, Humira.  I did ask her to talk with her rheumatologist about possibly considering treatment with an anti-CD 20 antibody to address this issue.  I will see her back in this clinic in 1 year or earlier if there is a problem.  Allena Katz, MD Allergy /  Immunology [place]

## 2021-06-04 NOTE — Patient Instructions (Addendum)
°  1.  Continue to perform allergen avoidance measures - dog, dust mite, pollen, mold  2.  Continue to treat and prevent inflammation:   A. Montelukast 10 mg - 1 tablet 1 time per day  3. If needed:   A. OTC Pataday - 1 drop each eye 1 time per day  C. OTC antihistamine  4. Return to clinic in 1 year or earlier if problem  5. Anti-CD20 antibody for autoimmune disease???  6.  Paxlovid for Covid and Tamiflu for influenza infection

## 2021-06-05 ENCOUNTER — Encounter: Payer: Self-pay | Admitting: Allergy and Immunology

## 2021-06-18 IMAGING — CT CT ABD-PELV W/ CM
2 of 4 series · 16 of 46 positions shown, 18 images · IV contrast (omnipaque)
Comparison: None.

CLINICAL DATA: Flank pain, fever

EXAM:
CT ABDOMEN AND PELVIS WITH CONTRAST
TECHNIQUE: Multidetector CT imaging of the abdomen and pelvis was performed
using the standard protocol following bolus administration of
intravenous contrast.
CONTRAST:  100mL OMNIPAQUE IOHEXOL 300 MG/ML  SOLN

[Series 3: abd/ pelvis 5.0 i30f 2 · axial · 0.74mm/px · z∈[-288,+112]mm · 13 of 88 slices shown, 15 images]
[im 4/88  soft-tissue]
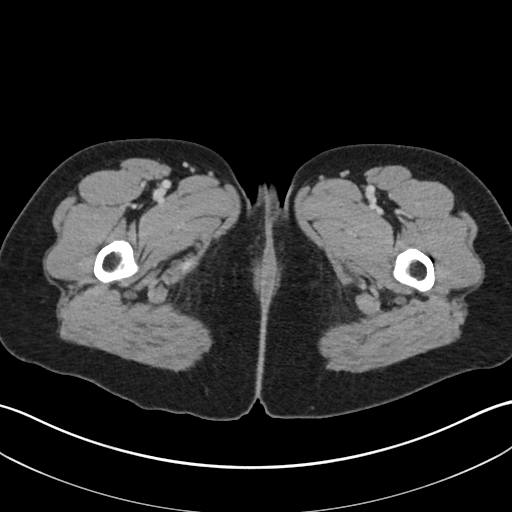
[im 4/88  bone]
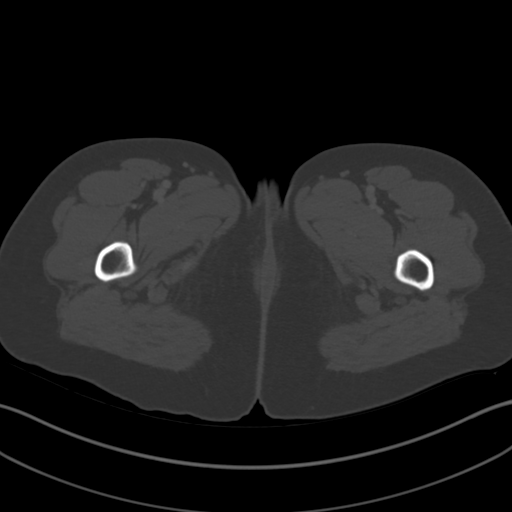
[im 11/88  soft-tissue]
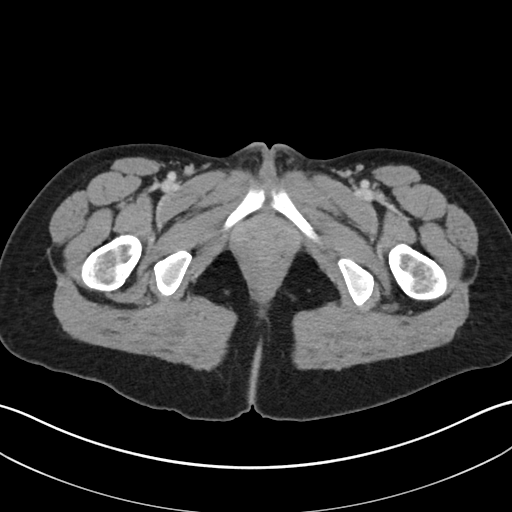
[im 17/88  soft-tissue]
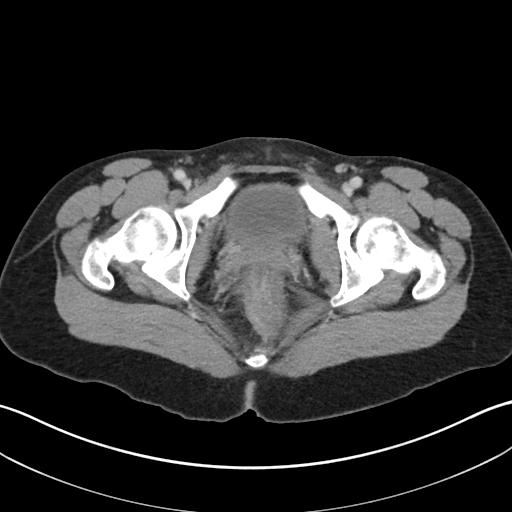
[im 24/88  soft-tissue]
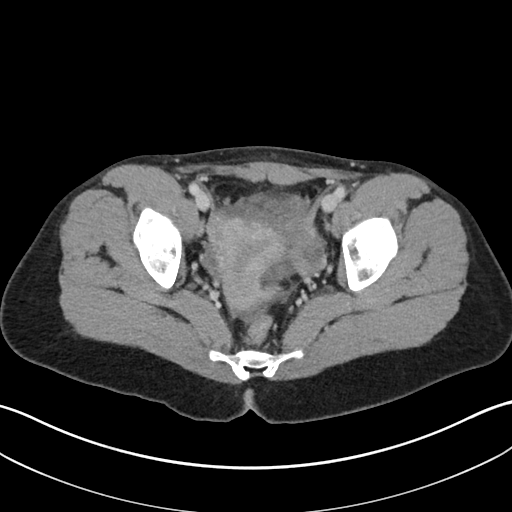
[im 31/88  soft-tissue]
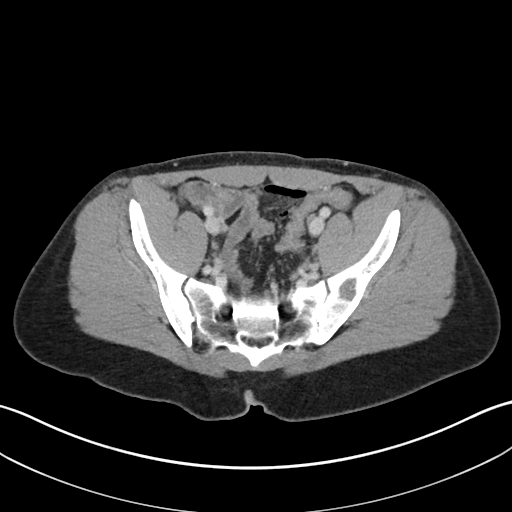
[im 37/88  soft-tissue]
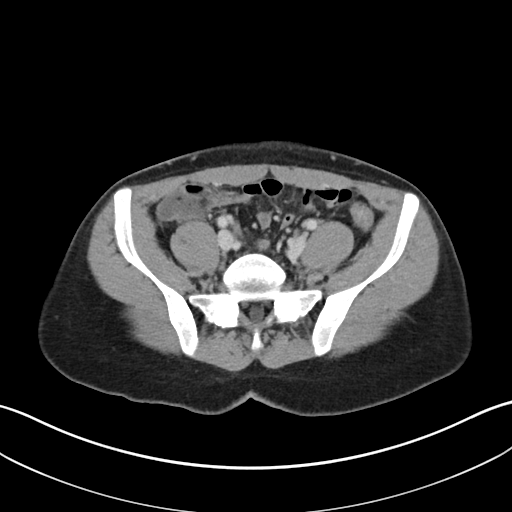
[im 44/88  soft-tissue]
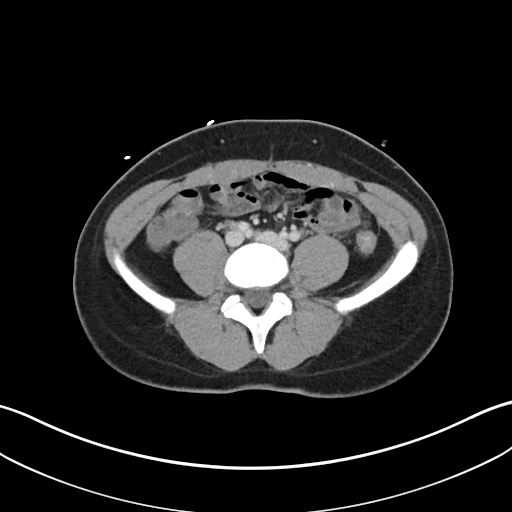
[im 51/88  soft-tissue]
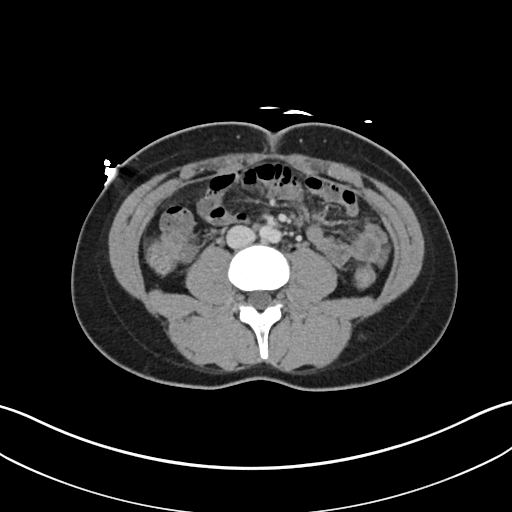
[im 57/88  soft-tissue]
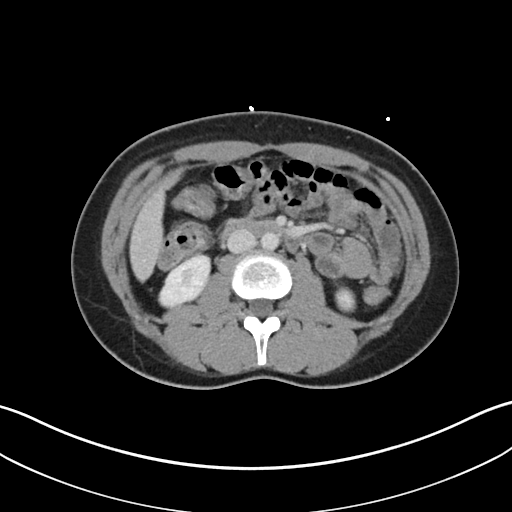
[im 57/88  bone]
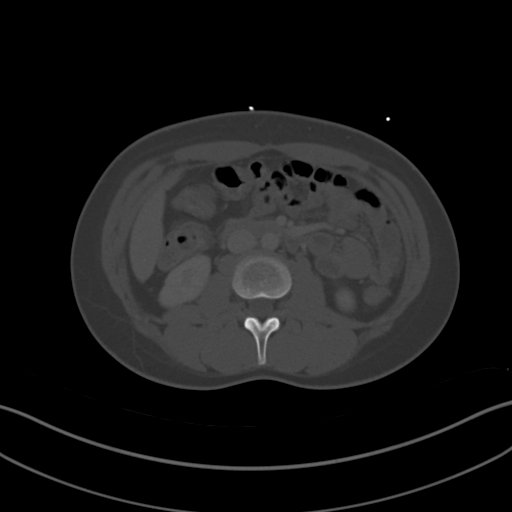
[im 64/88  soft-tissue]
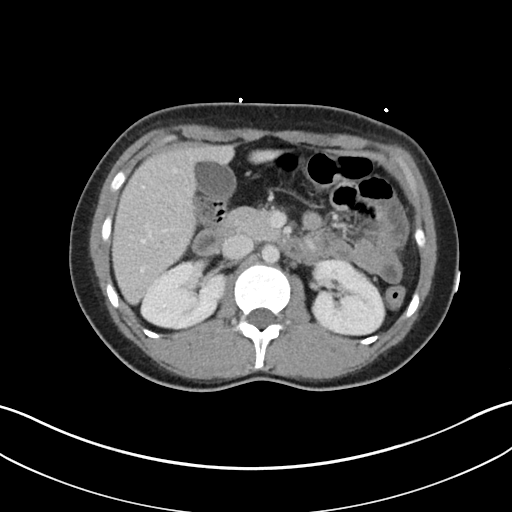
[im 71/88  soft-tissue]
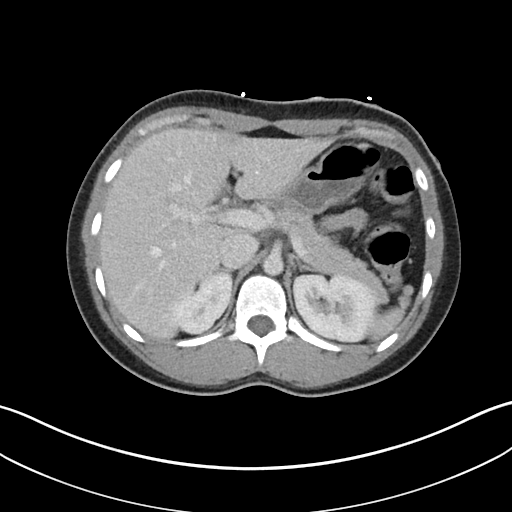
[im 77/88  soft-tissue]
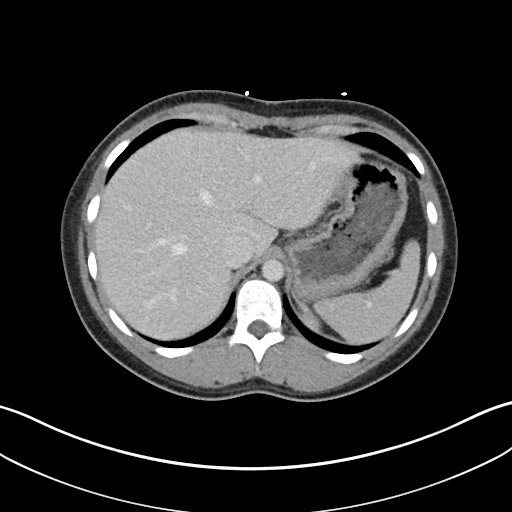
[im 84/88  soft-tissue]
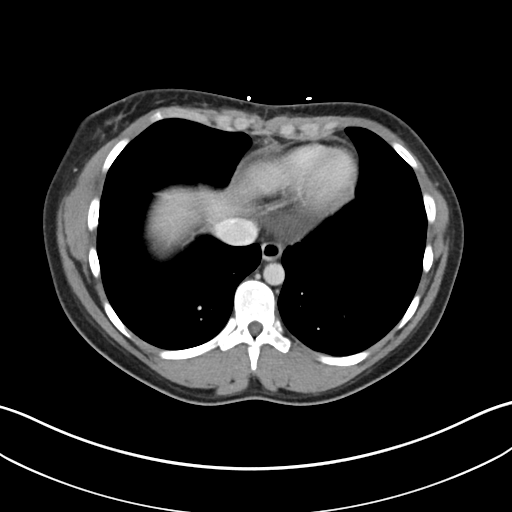

[Series 6: coronal soft tissue · coronal · 0.73mm/px · 3 of 79 slices shown]
[im 27/79  soft-tissue]
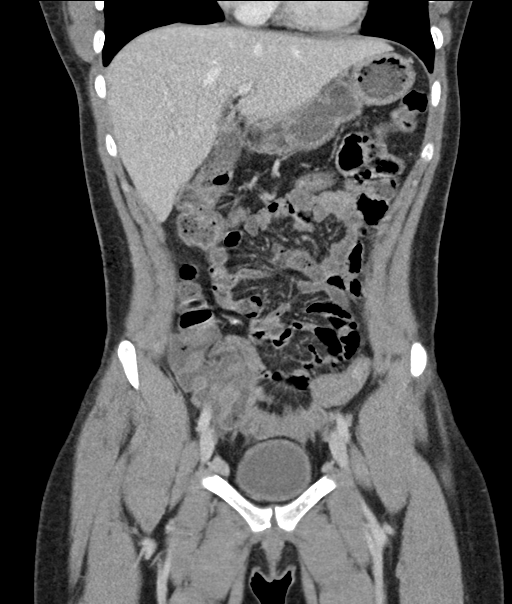
[im 35/79  soft-tissue]
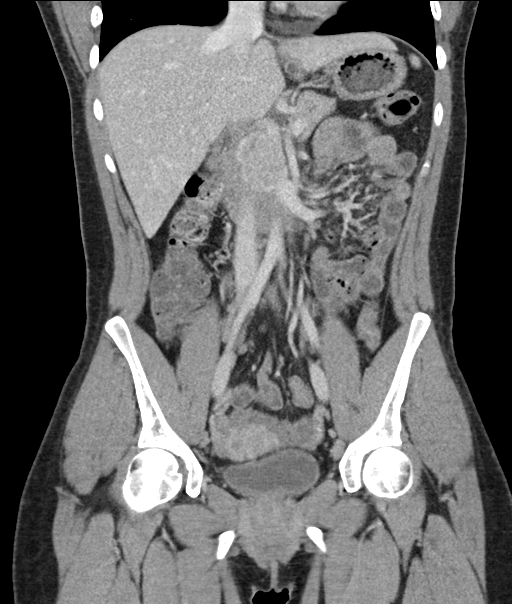
[im 44/79  soft-tissue]
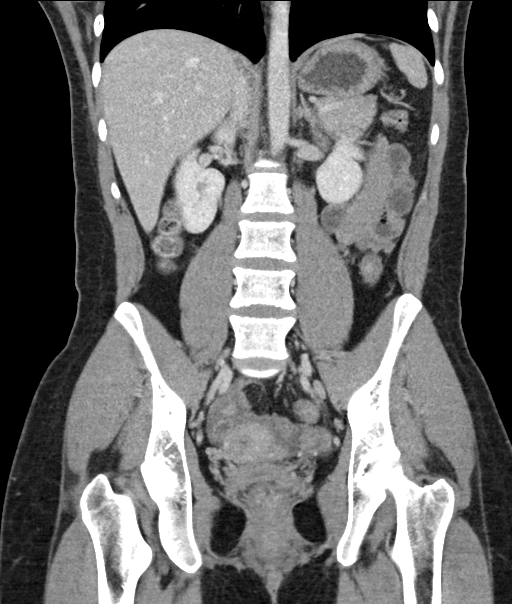

[16 of 46 positions shown; findings below may reference images not displayed]

FINDINGS: Lower chest: The visualized heart size within normal limits. No
pericardial fluid/thickening.

No hiatal hernia.

The visualized portions of the lungs are clear.

Hepatobiliary: The liver is normal in density without focal
abnormality.The main portal vein is patent. No evidence of calcified
gallstones, gallbladder wall thickening or biliary dilatation.

Pancreas: Unremarkable. No pancreatic ductal dilatation or
surrounding inflammatory changes.

Spleen: Normal in size without focal abnormality.

Adrenals/Urinary Tract: Both adrenal glands appear normal. The
kidneys and collecting system are normal in appearance. No
hydronephrosis or perinephric stranding. There does appear to be
mild bladder wall thickening with question of surrounding superior
bladder fat stranding changes.

Stomach/Bowel: The stomach, small bowel, and colon are normal in
appearance. No inflammatory changes, wall thickening, or obstructive
findings.The appendix is normal.

Vascular/Lymphatic: There are no enlarged mesenteric,
retroperitoneal, or pelvic lymph nodes. No significant vascular
findings are present.

Reproductive: The uterus and adnexa are unremarkable. a small amount
of free fluid seen within the cul-de-sac.

Other: No evidence of abdominal wall mass or hernia.

Musculoskeletal: No acute or significant osseous findings.
IMPRESSION: Findings which could be suggestive of cystitis. No renal or
collecting system calculi.

## 2021-08-16 ENCOUNTER — Other Ambulatory Visit: Payer: Self-pay

## 2021-08-16 ENCOUNTER — Encounter: Payer: Self-pay | Admitting: Family Medicine

## 2021-08-16 ENCOUNTER — Ambulatory Visit (INDEPENDENT_AMBULATORY_CARE_PROVIDER_SITE_OTHER): Payer: BC Managed Care – PPO | Admitting: Family Medicine

## 2021-08-16 VITALS — BP 120/76 | HR 88 | Temp 97.6°F | Resp 16 | Ht 63.0 in | Wt 154.4 lb

## 2021-08-16 DIAGNOSIS — D849 Immunodeficiency, unspecified: Secondary | ICD-10-CM

## 2021-08-16 DIAGNOSIS — J3089 Other allergic rhinitis: Secondary | ICD-10-CM | POA: Diagnosis not present

## 2021-08-16 DIAGNOSIS — H1010 Acute atopic conjunctivitis, unspecified eye: Secondary | ICD-10-CM | POA: Insufficient documentation

## 2021-08-16 DIAGNOSIS — H1013 Acute atopic conjunctivitis, bilateral: Secondary | ICD-10-CM | POA: Diagnosis not present

## 2021-08-16 DIAGNOSIS — L500 Allergic urticaria: Secondary | ICD-10-CM

## 2021-08-16 DIAGNOSIS — J302 Other seasonal allergic rhinitis: Secondary | ICD-10-CM

## 2021-08-16 MED ORDER — MONTELUKAST SODIUM 10 MG PO TABS: ORAL_TABLET | ORAL | 5 refills | Status: DC

## 2021-08-16 NOTE — Progress Notes (Signed)
? ?[place] [id number] ?Dept: [phone] ? ?FOLLOW UP NOTE ? ?Patient ID: Sydney Zimmerman, female    DOB: [birth year]  Age: 26 y.o. MRN: [id number] ?Date of Office Visit: 08/16/2021 ? ?Assessment  ?Chief Complaint: Perennial allergic Rhinitis (Patient would like to discuss changing allergy medications) ? ?HPI ?Sydney Zimmerman is a 26 year old female who presents the clinic for follow-up visit.  She was last seen in this clinic on 06/04/2021 by Dr. Neldon Mc for evaluation of allergic rhinitis, allergic conjunctivitis, and urticaria.  Her past medical history includes RA, uveitis, and Crohn's disease for which she follows rheumatology specialist.  At today's visit, she reports her allergic rhinitis has been poorly controlled for the last 1-1/2 months with symptoms including nasal congestion, sneeze, and postnasal drainage which is reported as worsening at nighttime.  She has recently started methotrexate and tapered down her daily steroid.  She reports that she has taken methotrexate previously with an increase in nasal congestion at that time as well.  She continues montelukast 10 mg once a day, occasional Allegra 180 mg, and triamcinolone nasal spray about once every other day with mild relief of symptoms.  She reports a poor application technique of nasal steroid spray.  She is not currently using a nasal saline rinse.   Her last environmental allergy skin testing was on 04/10/2020 and was positive to grass pollen, mold mix 3, mold mix 4, dog, and dust mite.  She does not currently have dust mite covers in place on her mattress or pillows. Allergic conjunctivitis is reported as moderately well controlled with occasional red and itchy eyes for which she uses Vis[place] clear eyes.  Allergic urticaria is reported as moderately well controlled with intermittent hives occurring anytime she touches her dog.  She reports these hives itch and resolve within 20 to 30 minutes.  She denies cardiopulmonary  and gastrointestinal symptoms with the hives.  Her current medications are listed in the chart.  ? ?Drug Allergies:  ?Allergies  ?Allergen Reactions  ? Sulfamethoxazole-Trimethoprim Hives and Itching  ?  Patient developed urticarial rash 3-4 days after starting Bactrim ?  ? Ibuprofen   ? Sulfamethoxazole Itching  ? Adalimumab Rash  ? ? ?Physical Exam: ?BP 120/76   Pulse 88   Temp 97.6 ?F (36.4 ?C)   Resp 16   Ht 5' 3"  (1.6 m)   Wt 154 lb 6.4 oz (70 kg)   SpO2 100%   BMI 27.35 kg/m?   ? ?Physical Exam ?Vitals reviewed.  ?Constitutional:   ?   Appearance: Normal appearance.  ?HENT:  ?   Head: Normocephalic and atraumatic.  ?   Right Ear: Tympanic membrane normal.  ?   Left Ear: Tympanic membrane normal.  ?   Nose:  ?   Comments: Bilateral nares edematous and pale with clear nasal drainage noted.  Pharynx normal.  Ears normal.  Eyes normal. ?   Mouth/Throat:  ?   Pharynx: Oropharynx is clear.  ?Eyes:  ?   Conjunctiva/sclera: Conjunctivae normal.  ?Cardiovascular:  ?   Rate and Rhythm: Normal rate and regular rhythm.  ?   Heart sounds: Normal heart sounds. No murmur heard. ?Pulmonary:  ?   Effort: Pulmonary effort is normal.  ?   Breath sounds: Normal breath sounds.  ?   Comments: Lungs clear to auscultation ?Musculoskeletal:     ?   General: Normal range of motion.  ?   Cervical back: Normal range of motion and neck supple.  ?  Skin: ?   General: Skin is warm and dry.  ?Neurological:  ?   Mental Status: She is alert and oriented to person, place, and time.  ?Psychiatric:     ?   Mood and Affect: Mood normal.     ?   Behavior: Behavior normal.     ?   Thought Content: Thought content normal.     ?   Judgment: Judgment normal.  ? ? ?Assessment and Plan: ?1. Seasonal and perennial allergic rhinitis   ?2. Seasonal allergic conjunctivitis   ?3. Allergic urticaria   ?4. Immunosuppression ([place])   ? ? ?Meds ordered this encounter  ?Medications  ? montelukast (SINGULAIR) 10 MG tablet  ?  Sig: Take one tablet once daily to  prevent as directed  ?  Dispense:  30 tablet  ?  Refill:  5  ? ? ?Patient Instructions  ?Allergic rhinitis ?Continue allergen avoidance measures directed toward pollen, mold, dog, and dust mite as listed below ?Continue montelukast 10 mg once a day ?Continue Nasacort 2 sprays in each nostril once a day as needed for stuffy nose.  In the right nostril, point the applicator out toward the right ear. In the left nostril, point the applicator out toward the left ear ?Continue Allegra 180 mg once a day as needed for runny nose or itch ?Consider saline nasal rinses as needed for nasal symptoms. Use this before any medicated nasal sprays for best result ? ?Allergic conjunctivitis ?Some over the counter eye drops include Pataday one drop in each eye once a day as needed for red, itchy eyes OR Zaditor one drop in each eye twice a day as needed for red itchy eyes. ? ?Allergic urticaria ?Continue Allegra 180 mg once a day as needed for itch ?If your symptoms re-occur, begin a journal of events that occurred for up to 6 hours before your symptoms began including foods and beverages consumed, soaps or perfumes you had contact with, and medications.  ? ?Call the clinic if this treatment plan is not working well for you ? ?Follow up in 3 months or sooner if needed. ? ? ?Return in about 1 year (around 08/17/2022), or if symptoms worsen or fail to improve. ?  ? ?Thank you for the opportunity to care for this patient.  Please do not hesitate to contact me with questions. ? ?Gareth Morgan, FNP ?[facility] ? ? ? ? ? ?

## 2021-08-16 NOTE — Patient Instructions (Addendum)
Allergic rhinitis ?Continue allergen avoidance measures directed toward pollen, mold, dog, and dust mite as listed below ?Continue montelukast 10 mg once a day ?Continue Nasacort 2 sprays in each nostril once a day as needed for stuffy nose.  In the right nostril, point the applicator out toward the right ear. In the left nostril, point the applicator out toward the left ear ?Continue Allegra 180 mg once a day as needed for runny nose or itch ?Consider saline nasal rinses as needed for nasal symptoms. Use this before any medicated nasal sprays for best result ?Consider allergen immunotherapy if the treatment plan as listed above is not effective in relieving your symptoms. ? ?Allergic conjunctivitis ?Some over the counter eye drops include Pataday one drop in each eye once a day as needed for red, itchy eyes OR Zaditor one drop in each eye twice a day as needed for red itchy eyes. ? ?Allergic urticaria ?Continue Allegra 180 mg once a day as needed for itch ?If your symptoms re-occur, begin a journal of events that occurred for up to 6 hours before your symptoms began including foods and beverages consumed, soaps or perfumes you had contact with, and medications.  ? ?Call the clinic if this treatment plan is not working well for you ? ?Follow up in 3 months or sooner if needed. ? ?Reducing Pollen Exposure ?The American Academy of Allergy, Asthma and Immunology suggests the following steps to reduce your exposure to pollen during allergy seasons. ?Do not hang sheets or clothing out to dry; pollen may collect on these items. ?Do not mow lawns or spend time around freshly cut grass; mowing stirs up pollen. ?Keep windows closed at night.  Keep car windows closed while driving. ?Minimize morning activities outdoors, a time when pollen counts are usually at their highest. ?Stay indoors as much as possible when pollen counts or humidity is high and on windy days when pollen tends to remain in the air longer. ?Use air  conditioning when possible.  Many air conditioners have filters that trap the pollen spores. ?Use a HEPA room air filter to remove pollen form the indoor air you breathe. ? ?Control of Mold Allergen ?Mold and fungi can grow on a variety of surfaces provided certain temperature and moisture conditions exist.  Outdoor molds grow on plants, decaying vegetation and soil.  The major outdoor mold, Alternaria and Cladosporium, are found in very high numbers during hot and dry conditions.  Generally, a late Summer - Fall peak is seen for common outdoor fungal spores.  Rain will temporarily lower outdoor mold spore count, but counts rise rapidly when the rainy period ends.  The most important indoor molds are Aspergillus and Penicillium.  Dark, humid and poorly ventilated basements are ideal sites for mold growth.  The next most common sites of mold growth are the bathroom and the kitchen. ? ?Outdoor [employer] ?Use air conditioning and keep windows closed ?Avoid exposure to decaying vegetation. ?Avoid leaf raking. ?Avoid grain handling. ?Consider wearing a face mask if working in moldy areas. ? ?Indoor Mold Control ?Maintain humidity below 50%. ?Clean washable surfaces with 5% bleach solution. ?Remove sources e.g. Contaminated carpets. ? ?Control of Dog or Cat Allergen ?Avoidance is the best way to manage a dog or cat allergy. If you have a dog or cat and are allergic to dog or cats, consider removing the dog or cat from the home. ?If you have a dog or cat but don?t want to find it a new home, or  if your family wants a pet even though someone in the household is allergic, here are some strategies that may help keep symptoms at bay: ? ?Keep the pet out of your bedroom and restrict it to only a few rooms. Be advised that keeping the dog or cat in only one room will not limit the allergens to that room. ?Don?t pet, hug or kiss the dog or cat; if you do, wash your hands with soap and water. ?High-efficiency particulate air  (HEPA) cleaners run continuously in a bedroom or living room can reduce allergen levels over time. ?Regular use of a high-efficiency vacuum cleaner or a central vacuum can reduce allergen levels. ?Giving your dog or cat a bath at least once a week can reduce airborne allergen. ? ? ?Control of Dust Mite Allergen ?Dust mites play a major role in allergic asthma and rhinitis. They occur in environments with high humidity wherever human skin is found. Dust mites absorb humidity from the atmosphere (ie, they do not drink) and feed on organic matter (including shed human and animal skin). Dust mites are a microscopic type of insect that you cannot see with the naked eye. High levels of dust mites have been detected from mattresses, pillows, carpets, upholstered furniture, bed covers, clothes, soft toys and any woven material. The principal allergen of the dust mite is found in its feces. A gram of dust may contain 1,000 mites and 250,000 fecal particles. Mite antigen is easily measured in the air during house cleaning activities. Dust mites do not bite and do not cause harm to humans, other than by triggering allergies/asthma. ? ?Ways to decrease your exposure to dust mites in your home: ? ?1. Encase mattresses, box springs and pillows with a mite-impermeable barrier or cover ? ?2. Wash sheets, blankets and drapes weekly in hot water (130? F) with detergent and dry them in a dryer on the hot setting. ? ?3. Have the room cleaned frequently with a vacuum cleaner and a damp dust-mop. For carpeting or rugs, vacuuming with a vacuum cleaner equipped with a high-efficiency particulate air (HEPA) filter. The dust mite allergic individual should not be in a room which is being cleaned and should wait 1 hour after cleaning before going into the room. ? ?4. Do not sleep on upholstered furniture (eg, couches). ? ?5. If possible removing carpeting, upholstered furniture and drapery from the home is ideal. Horizontal blinds should be  eliminated in the rooms where the person spends the most time (bedroom, study, television room). Washable vinyl, roller-type shades are optimal. ? ?6. Remove all non-washable stuffed toys from the bedroom. Wash stuffed toys weekly like sheets and blankets above. ? ?7. Reduce indoor humidity to less than 50%. Inexpensive humidity monitors can be purchased at most hardware stores. Do not use a humidifier as can make the problem worse and are not recommended. ? ? ?

## 2021-12-18 IMAGING — DX DG CHEST 1V PORT
1 series · 1 of 1 positions shown · non-contrast
Comparison: Single-view of the chest 03/21/2019.

CLINICAL DATA: Cough when lying down. The patient was diagnosed
with EMRA1-DA 1 week ago.

EXAM:
PORTABLE CHEST 1 VIEW

[chest ap]
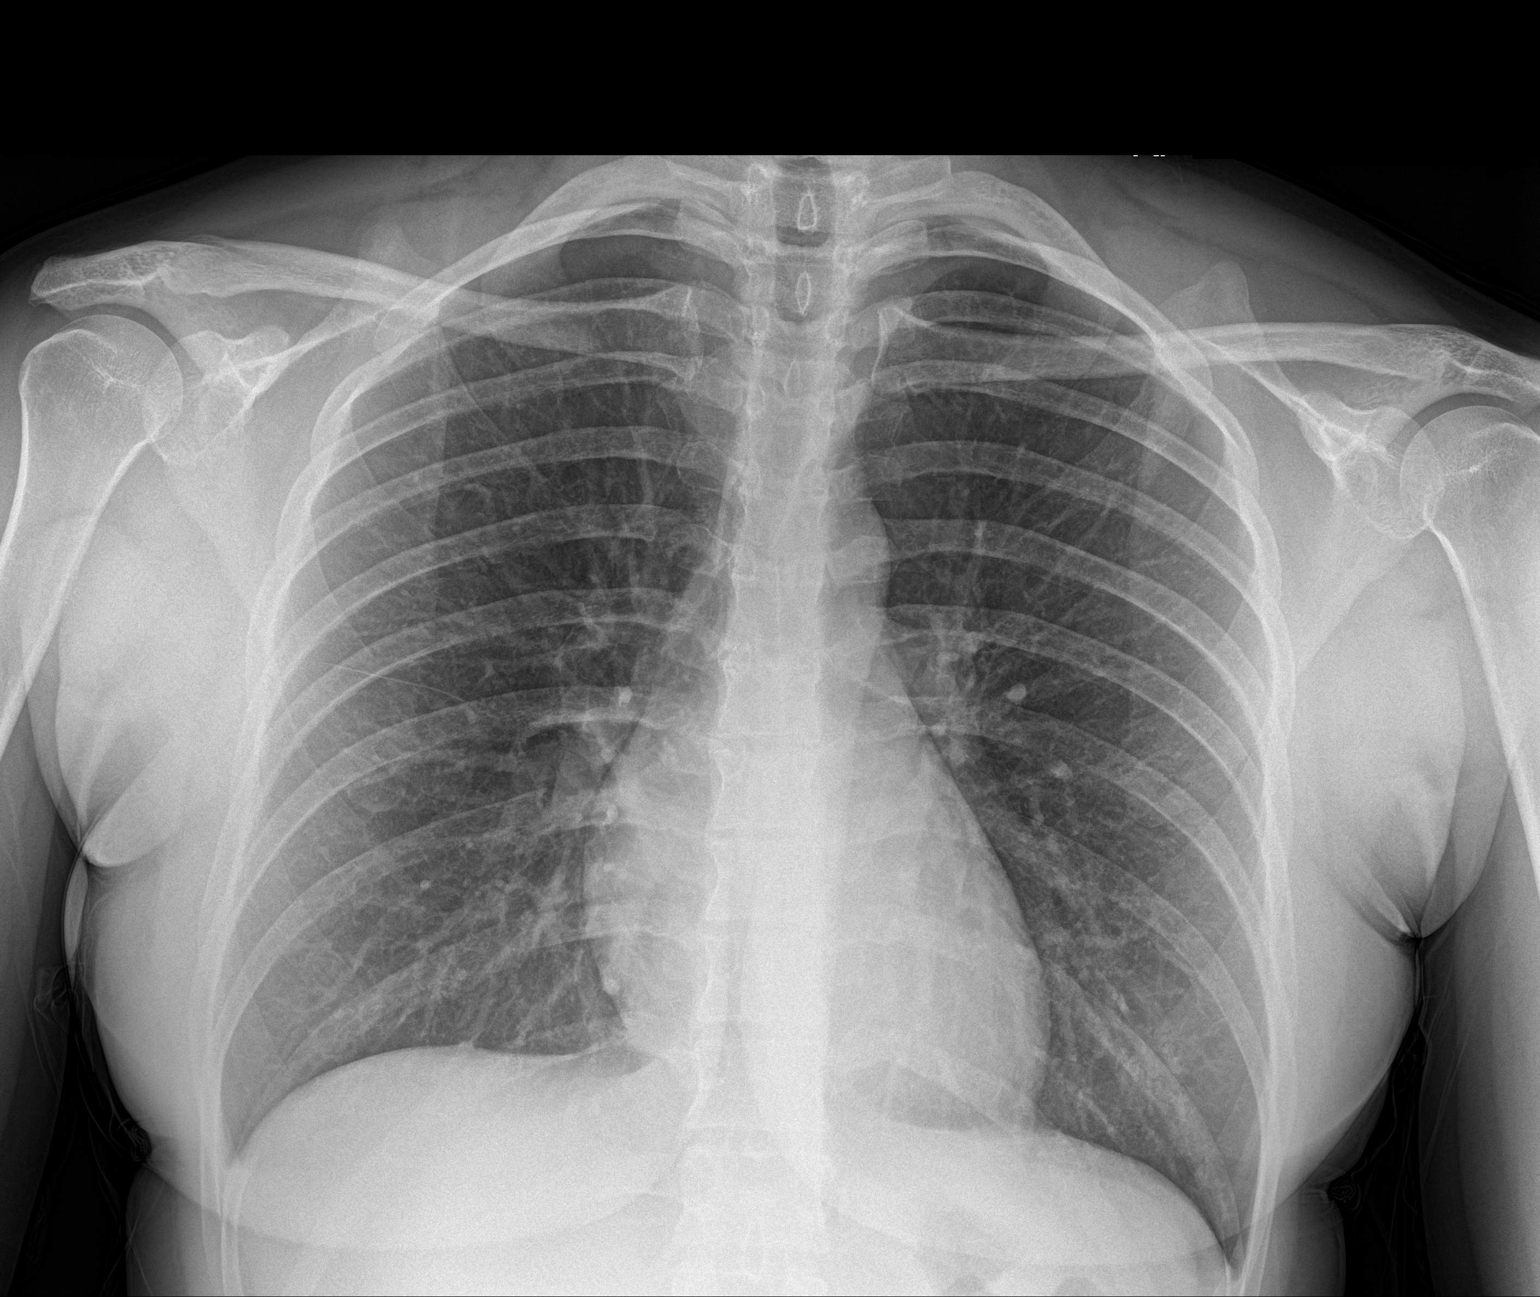

[1 of 1 positions shown; findings below may reference images not displayed]

FINDINGS: The lungs are clear. Heart size is normal. No pneumothorax or
pleural fluid. No acute or focal bony abnormality.
IMPRESSION: Normal chest.

## 2022-01-16 ENCOUNTER — Telehealth: Payer: Self-pay | Admitting: Gastroenterology

## 2022-01-16 NOTE — Telephone Encounter (Signed)
Pt states for the past couple of weeks she has been having 6-7 diarrhea stools/day. She reports she is taking the metamucil and it helps with the first stool of the day having some form, she states she just took a bentyl. Per last OV note pt was instructed to take Imodium as needed. She has not been taking the Imodium. Pt will try adding this and continue the bentyl and metamucil.

## 2022-01-16 NOTE — Telephone Encounter (Signed)
Patient called seeking advise states she has diarrhea

## 2022-01-26 ENCOUNTER — Other Ambulatory Visit: Payer: Self-pay | Admitting: Allergy and Immunology

## 2022-01-28 ENCOUNTER — Ambulatory Visit (INDEPENDENT_AMBULATORY_CARE_PROVIDER_SITE_OTHER): Payer: BC Managed Care – PPO | Admitting: Gastroenterology

## 2022-01-28 ENCOUNTER — Encounter: Payer: Self-pay | Admitting: Gastroenterology

## 2022-01-28 VITALS — BP 126/72 | HR 86 | Ht 63.0 in | Wt 152.0 lb

## 2022-01-28 DIAGNOSIS — R110 Nausea: Secondary | ICD-10-CM | POA: Diagnosis not present

## 2022-01-28 DIAGNOSIS — R6881 Early satiety: Secondary | ICD-10-CM

## 2022-01-28 DIAGNOSIS — K580 Irritable bowel syndrome with diarrhea: Secondary | ICD-10-CM

## 2022-01-28 MED ORDER — ONDANSETRON HCL 4 MG PO TABS: 4.0000 mg | ORAL_TABLET | Freq: Four times a day (QID) | ORAL | 1 refills | Status: DC | PRN

## 2022-01-28 MED ORDER — DIPHENOXYLATE-ATROPINE 2.5-0.025 MG PO TABS: 1.0000 | ORAL_TABLET | Freq: Four times a day (QID) | ORAL | 0 refills | Status: DC | PRN

## 2022-01-28 NOTE — Patient Instructions (Addendum)
_______________________________________________________  If you are age 25 or older, your body mass index should be between 23-30. Your Body mass index is 26.93 kg/m. If this is out of the aforementioned range listed, please consider follow up with your Primary Care Provider.  If you are age 41 or younger, your body mass index should be between 19-25. Your Body mass index is 26.93 kg/m. If this is out of the aformentioned range listed, please consider follow up with your Primary Care Provider.   The  GI providers would like to encourage you to use [facility] to communicate with providers for non-urgent requests or questions.  Due to long hold times on the telephone, sending your provider a message by [facility] may be a faster and more efficient way to get a response.  Please allow 48 business hours for a response.  Please remember that this is for non-urgent requests.   We have sent the following medications to your pharmacy for you to pick up at your convenience: Lomotil take one tablet every 6 hours as needed for diarrhea, Zofran 4 mg every 6 hours as needed for nausea.    You have been scheduled for an endoscopy. Please follow written instructions given to you at your visit today. If you use inhalers (even only as needed), please bring them with you on the day of your procedure.   It was a pleasure to see you today!  Thank you for trusting me with your gastrointestinal care!    Scott E. Candis Schatz, MD

## 2022-01-28 NOTE — Progress Notes (Unsigned)
HPI : Sydney Zimmerman is a very pleasant 26 year old female who I initially saw September 2022.  She had initially been diagnosed with Crohn's disease, but this was subsequently really classified as irritable bowel syndrome.  She does have rheumatoid arthritis and chronic uveitis and is followed by rheumatology and ophthalmology. At her last visit in November, she was doing well and it was recommended she continue taking Metamucil daily and to use loperamide as needed when she would not have to be access to a bathroom.  I also recommended she start taking Bentyl in the evenings and then as needed during the rest the day.  Today, the patient reports that she had been doing well until about 3 weeks ago when her diarrhea worsened.  She has been having about 6 loose stools per day with urgency, no incontinence.  She has crampy abdominal pain which usually precedes the urge to defecate, and improves following defecation.  She reports that the Bentyl does help with this crampy abdominal pain. The patient reports that she tried taking Imodium, but she noted facial swelling and hives when she took it.  In addition, she reports that she has not been able to eat as much for several months now.  She has been unable to eat a full meal, and gets nauseated during meals.  She reports upper abdominal discomfort frequently while eating as well.  Her mother was present via telephone during this conversation and expressed great concern about these symptoms.  She denies any changes in her diet to accompany this worsening of her symptoms.  She is a vegetarian and is not changed her typical foods recently.  Her medications have also been the same, although her methotrexate dose was recently increased to 8 tablets from 6/week.  Her weight has been stable.  Past Medical History:  Diagnosis Date   Crohn disease ([place]) 03/22/2019   IBS (irritable bowel syndrome) 05/13/2021   Rheumatoid arthritis ([place])      Past Surgical  History:  Procedure Laterality Date   CATARACT EXTRACTION Right    COLONOSCOPY     GLAUCOMA SURGERY     Family History  Problem Relation Age of Onset   Colon polyps Mother    Colon polyps Maternal Grandmother    Esophageal cancer Maternal Grandmother    Diabetes Maternal Grandmother    Colon cancer Neg Hx    Stomach cancer Neg Hx    Social History   Tobacco Use   Smoking status: Never   Smokeless tobacco: Never  Substance Use Topics   Alcohol use: Yes   Drug use: Not Currently   Current Outpatient Medications  Medication Sig Dispense Refill   acetaminophen (TYLENOL) 650 MG CR tablet Take 1,300 mg by mouth every 8 (eight) hours as needed for pain.     dicyclomine (BENTYL) 20 MG tablet TAKE 1 TABLET BY MOUTH EVERY 6 HOURS 30 tablet 1   folic acid (FOLVITE) 1 MG tablet Take 2 tablets by mouth daily at 12 noon.     Golimumab (SIMPONI) 50 MG/0.5ML SOAJ Inject 0.5 mLs into the skin every 28 (twenty-eight) days.     ketorolac (ACULAR) 0.4 % SOLN Place 1 drop into the right eye 4 (four) times daily.     methotrexate (RHEUMATREX) 2.5 MG tablet Take 10 mg by mouth once a week.     montelukast (SINGULAIR) 10 MG tablet TAKE ONE TABLET ONCE DAILY TO PREVENT AS DIRECTED 90 tablet 1   prednisoLONE acetate (PRED FORTE) 1 % ophthalmic  suspension 1 drop every other day.     predniSONE (DELTASONE) 5 MG tablet Take 1 tablet (5 mg total) by mouth 2 (two) times daily with a meal. 30 tablet 0   No current facility-administered medications for this visit.   Allergies  Allergen Reactions   Sulfamethoxazole-Trimethoprim Hives and Itching    Patient developed urticarial rash 3-4 days after starting Bactrim    Ibuprofen    Imodium [Loperamide] Swelling   Sulfamethoxazole Itching   Adalimumab Rash     Review of Systems: All systems reviewed and negative except where noted in HPI.    No results found.  Physical Exam: BP 126/72   Pulse 86   Ht 5' 3"  (1.6 m)   Wt 152 lb (68.9 kg)   BMI  26.93 kg/m  Constitutional: Pleasant,well-developed, African-American female in no acute distress. Cardiovascular: Normal rate, regular rhythm.  Pulmonary/chest: Effort normal and breath sounds normal. No wheezing, rales or rhonchi. Abdominal: Soft, nondistended, nontender. Bowel sounds active throughout. There are no masses palpable. No hepatomegaly. Extremities: no edema Neurological: Alert and oriented to person place and time. Skin: Skin is warm and dry. No rashes noted. Psychiatric: Normal mood and affect. Behavior is normal.  CBC    Component Value Date/Time   WBC 6.8 03/22/2019 0744   RBC 4.08 03/22/2019 0744   HGB 10.9 (L) 03/22/2019 0744   HCT 33.4 (L) 03/22/2019 0744   PLT 394 03/22/2019 0744   MCV 81.9 03/22/2019 0744   MCH 26.7 03/22/2019 0744   MCHC 32.6 03/22/2019 0744   RDW 15.6 (H) 03/22/2019 0744   LYMPHSABS 0.8 03/22/2019 0744   MONOABS 0.5 03/22/2019 0744   EOSABS 0.0 03/22/2019 0744   BASOSABS 0.0 03/22/2019 0744    CMP     Component Value Date/Time   NA 138 03/22/2019 0744   K 3.8 03/22/2019 0744   CL 108 03/22/2019 0744   CO2 21 (L) 03/22/2019 0744   GLUCOSE 106 (H) 03/22/2019 0744   BUN 6 03/22/2019 0744   CREATININE 0.62 03/22/2019 0744   CALCIUM 8.2 (L) 03/22/2019 0744   PROT 7.3 03/14/2021 1007   ALBUMIN 3.9 03/14/2021 1007   AST 10 03/14/2021 1007   ALT 12 03/14/2021 1007   ALKPHOS 41 03/14/2021 1007   BILITOT 0.2 03/14/2021 1007   GFRNONAA >60 03/22/2019 0744   GFRAA >60 03/22/2019 0744     ASSESSMENT AND PLAN: 26 year old female with IBS, with 3 weeks of worsening symptoms of diarrhea, urgency and crampy abdominal pain.  She reported symptoms of facial swelling when she took Imodium.  We discussed how IBS can go through periods of exacerbations like this.  I suggest that she start taking her Bentyl scheduled during this time, and to try Lomotil instead of Imodium to reduce her stool frequency and urgency.  The patient is also been  reporting symptoms of early satiety and postprandial nausea and abdominal pain.  Although I think the symptoms are probably within the spectrum of her IBS, her mother was very concerned about them and requested an upper endoscopy.  Although I think the likelihood of finding something serious on an upper endoscopy is low, I did agree to perform an upper endoscopy to make sure that there are no anatomic reasons for her to have the symptoms.  IBS - Continue Bentyl, recommend scheduled dosing until symptoms improves - Start Lomotil 1 tablet 3-4 times per day as needed for diarrhea  Early satiety, postprandial nausea - EGD  The details, risks (including  bleeding, perforation, infection, missed lesions, medication reactions and possible hospitalization or surgery if complications occur), benefits, and alternatives to EGD with possible biopsy and possible dilation were discussed with the patient and she consents to proceed.   Eliza Grissinger E. Candis Schatz, MD [facility]   CC:  Lin Landsman, MD

## 2022-01-29 ENCOUNTER — Encounter: Payer: Self-pay | Admitting: Gastroenterology

## 2022-01-31 ENCOUNTER — Encounter: Payer: BC Managed Care – PPO | Admitting: Gastroenterology

## 2022-02-06 ENCOUNTER — Telehealth: Payer: Self-pay | Admitting: Gastroenterology

## 2022-02-06 IMAGING — CR DG CHEST 2V
2 series · 2 of 2 positions shown · non-contrast
Comparison: September 21, 2019

CLINICAL DATA: Orthopnea.  Recent [id number] positive

EXAM:
CHEST - 2 VIEW

[w chest pa]
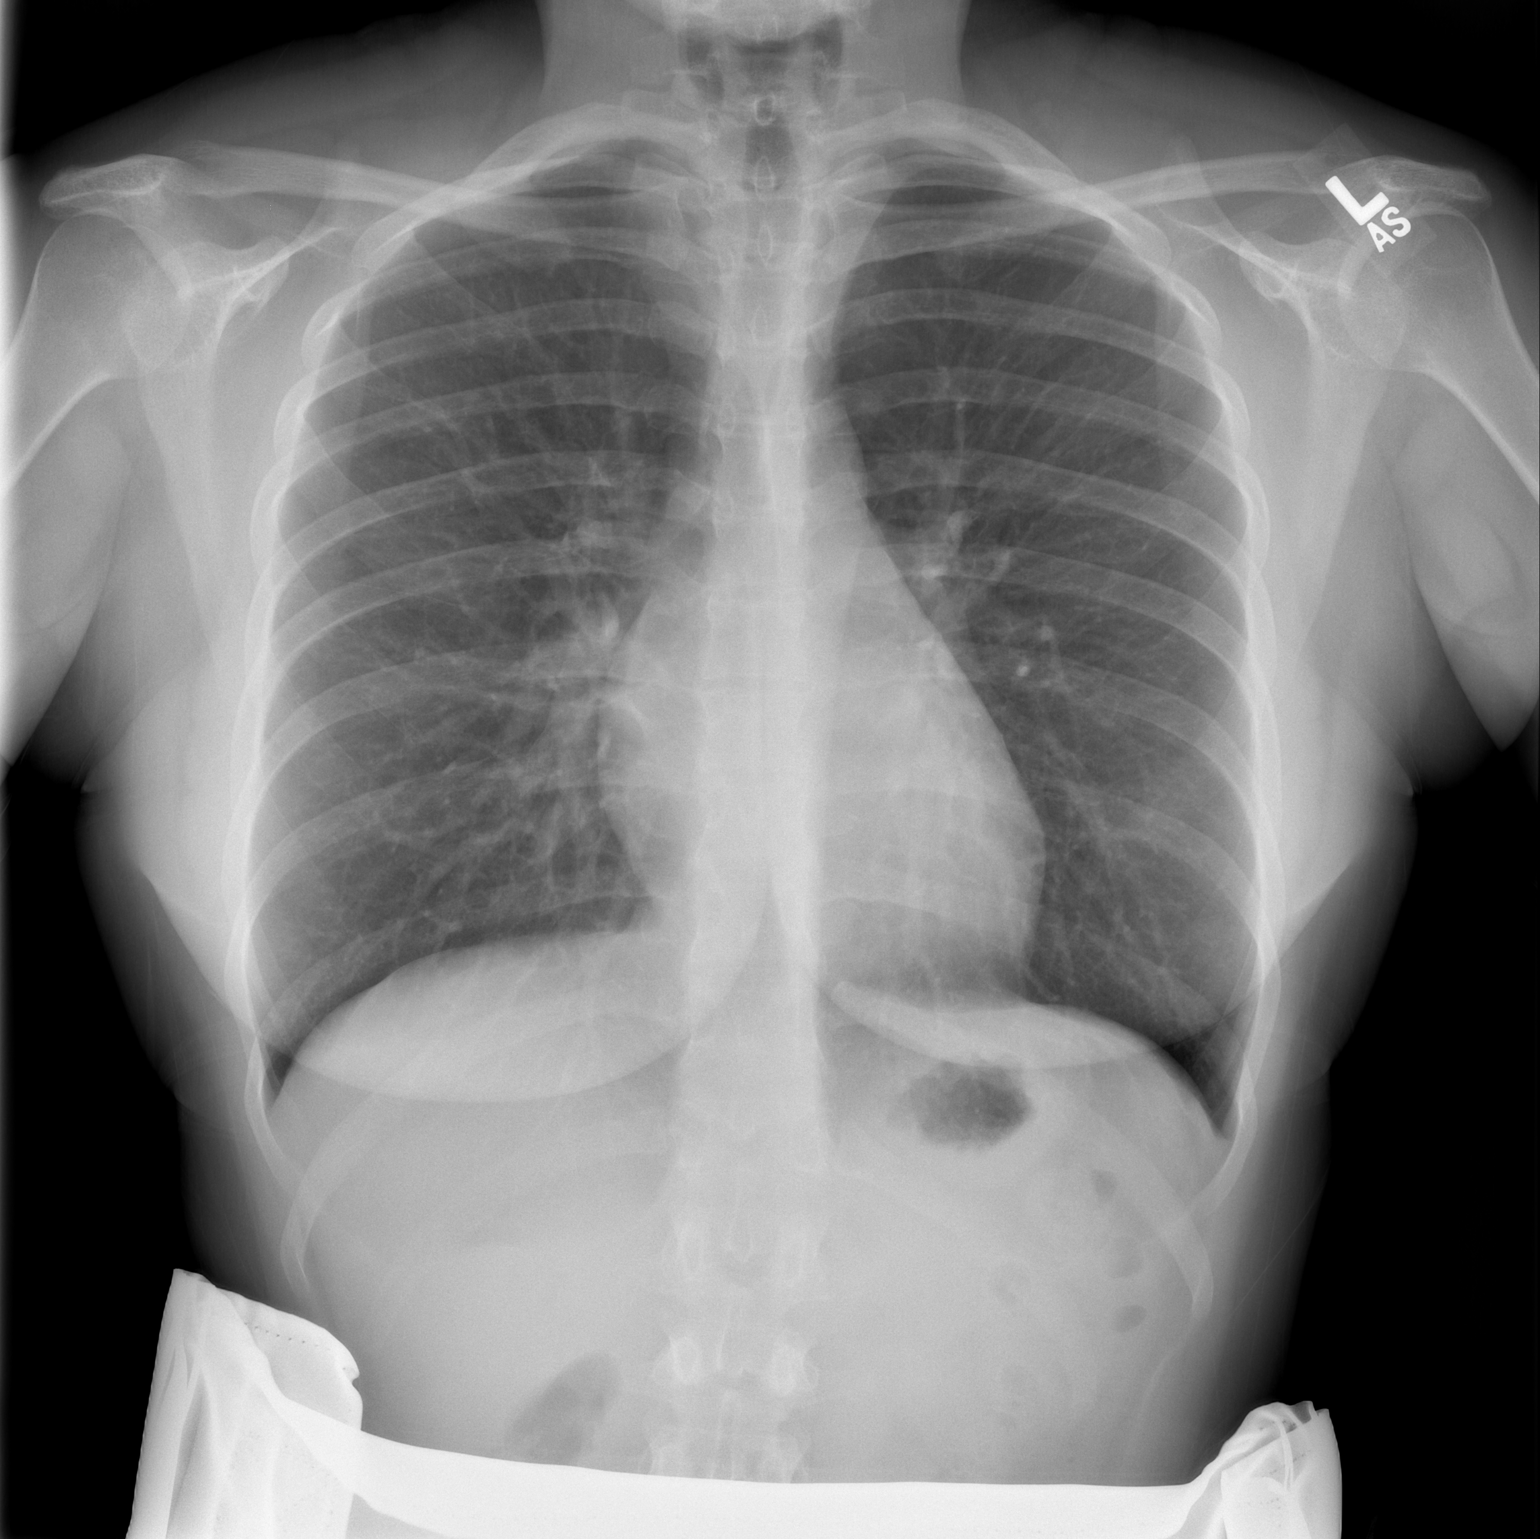

[w chest lat]
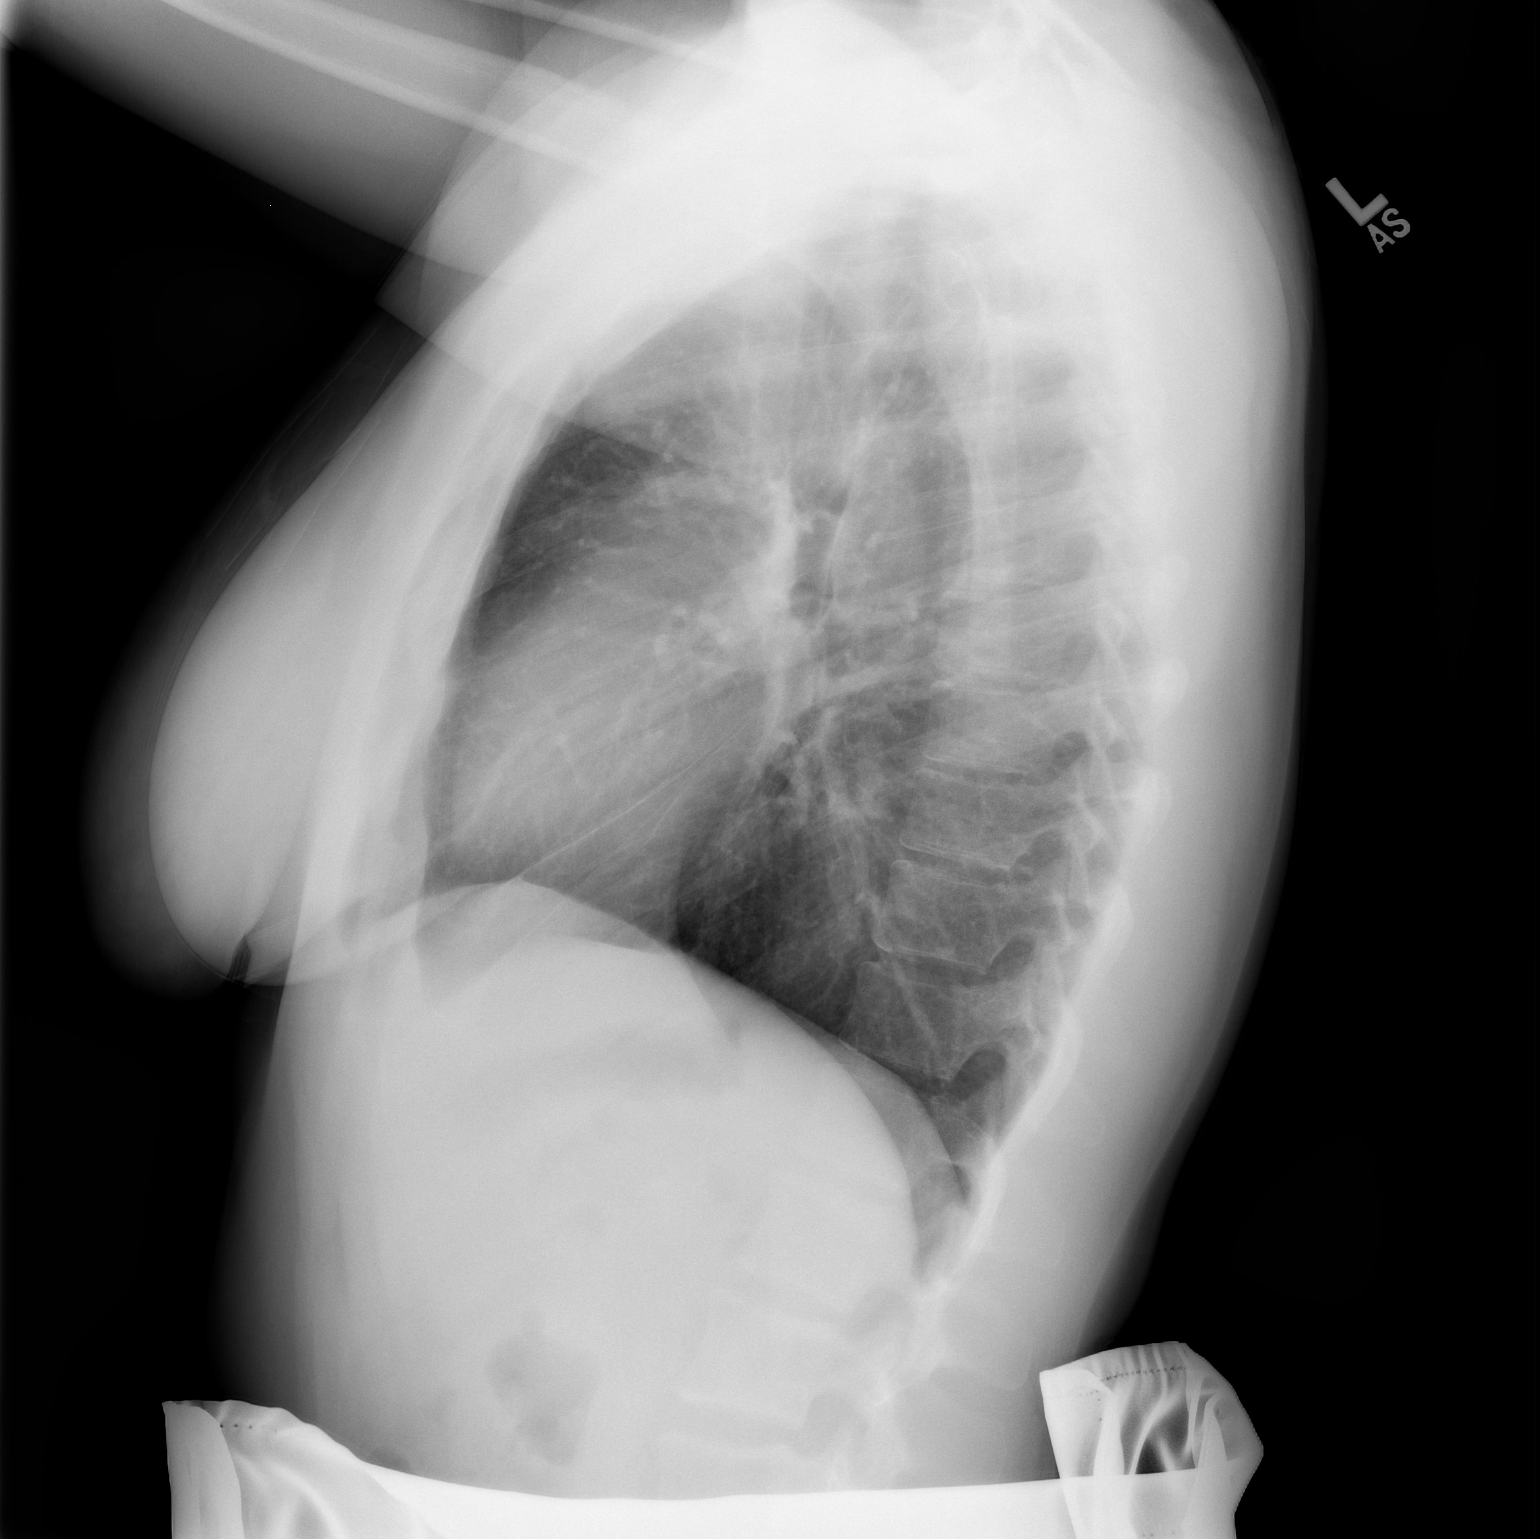

[2 of 2 positions shown; findings below may reference images not displayed]

FINDINGS: Lungs are clear. Heart size and pulmonary vascularity are normal. No
adenopathy. No bone lesions.
IMPRESSION: Lungs clear.  Cardiac silhouette within normal limits.

## 2022-02-06 NOTE — Telephone Encounter (Signed)
Appt cancelled, Dr. Candis Schatz notified.

## 2022-02-06 NOTE — Telephone Encounter (Signed)
Inbound call from patient cancelling upcoming EGD 02/20/22 at 10:00. Patient states her symptoms has resolved and she no longer needs to have the procedure. She also states she will not be rescheduling. Please give a call if needing to further advise.

## 2022-02-20 ENCOUNTER — Encounter: Payer: BC Managed Care – PPO | Admitting: Gastroenterology

## 2022-04-06 ENCOUNTER — Other Ambulatory Visit: Payer: Self-pay | Admitting: Gastroenterology

## 2022-05-27 ENCOUNTER — Ambulatory Visit: Payer: BC Managed Care – PPO | Admitting: Allergy and Immunology

## 2022-06-25 ENCOUNTER — Encounter (HOSPITAL_BASED_OUTPATIENT_CLINIC_OR_DEPARTMENT_OTHER): Payer: Self-pay

## 2022-06-25 DIAGNOSIS — R5383 Other fatigue: Secondary | ICD-10-CM

## 2022-06-25 DIAGNOSIS — R0683 Snoring: Secondary | ICD-10-CM

## 2022-07-29 ENCOUNTER — Other Ambulatory Visit: Payer: Self-pay

## 2022-07-29 ENCOUNTER — Ambulatory Visit (INDEPENDENT_AMBULATORY_CARE_PROVIDER_SITE_OTHER): Payer: BC Managed Care – PPO | Admitting: Allergy and Immunology

## 2022-07-29 ENCOUNTER — Encounter: Payer: Self-pay | Admitting: Allergy and Immunology

## 2022-07-29 VITALS — BP 110/70 | HR 80 | Temp 98.2°F | Resp 16 | Ht 63.0 in | Wt 132.0 lb

## 2022-07-29 DIAGNOSIS — D849 Immunodeficiency, unspecified: Secondary | ICD-10-CM | POA: Diagnosis not present

## 2022-07-29 DIAGNOSIS — J301 Allergic rhinitis due to pollen: Secondary | ICD-10-CM

## 2022-07-29 DIAGNOSIS — L500 Allergic urticaria: Secondary | ICD-10-CM | POA: Diagnosis not present

## 2022-07-29 DIAGNOSIS — J3089 Other allergic rhinitis: Secondary | ICD-10-CM | POA: Diagnosis not present

## 2022-07-29 NOTE — Patient Instructions (Signed)
  1.  Continue to perform allergen avoidance measures - dog, dust mite, pollen, mold  2.  Continue to treat and prevent inflammation:   A. Montelukast 10 mg - 1 tablet 1 time per day  B. OTC Nasacort - 1-2 sprays each nostril 3-7 times per week  3. If needed:   A. OTC antihistamine - allegra / zyrtec / claritin  4. "Stress Steroids"  5. Return to clinic in 1 year or earlier if problem

## 2022-07-29 NOTE — Progress Notes (Unsigned)
[facility] [place] [facility] [place]   Follow-up Note  Referring Provider: Lin Landsman, MD Primary Provider: Lin Landsman, MD Date of Office Visit: 07/29/2022  Subjective:   Sydney Zimmerman (DOB: May 20, 1996) is a 27 y.o. female who returns to the [facility] [place] on 07/29/2022 in re-evaluation of the following:  HPI: Sydney Zimmerman returned to this clinic in evaluation of allergic rhinoconjunctivitis and urticaria in the context of autoimmune disease including RA and Crohn's disease with uveitis.  I last saw her in this clinic 04 June 2021.  It is really springtime that gets her nose and her eyes messed up.  She does use a combination of various medications including montelukast and occasionally a nasal steroid and occasionally an antihistamine on an intermittent basis throughout the year but she needs a plan for this spring.  She has not had any episodes or urticaria.  Sometimes if she gets really hot she will develop some red spots on her skin without any associated systemic or constitutional symptoms.  She has been taken off her prolonged prednisone administration at the tail end of December 2023.  She is using an anti-TNF biologic and methotrexate at this point time to treat her autoimmune disease.  Allergies as of 07/29/2022       Reactions   Sulfamethoxazole-trimethoprim Hives, Itching   Patient developed urticarial rash 3-4 days after starting Bactrim   Ibuprofen    Imodium [loperamide] Swelling   Sulfamethoxazole Itching   Adalimumab Rash        Medication List    acetaminophen 650 MG CR tablet Commonly known as: TYLENOL Take 1,300 mg by mouth every 8 (eight) hours as needed for pain.   amphetamine-dextroamphetamine 20 MG tablet Commonly known as: ADDERALL Take 20 mg by mouth daily.   dicyclomine 20 MG tablet Commonly known as: BENTYL TAKE 1 TABLET BY MOUTH EVERY 6 HOURS   diphenoxylate-atropine 2.5-0.025 MG tablet Commonly  known as: LOMOTIL TAKE 1 TABLET 4 TIMES DAILY AS NEEDED FOR DIARRHEA OR LOOSE STOLLS   folic acid 1 MG tablet Commonly known as: FOLVITE Take 2 tablets by mouth daily at 12 noon.   ketorolac 0.4 % Soln Commonly known as: ACULAR Place 1 drop into the right eye 4 (four) times daily.   methotrexate 2.5 MG tablet Commonly known as: RHEUMATREX Take 2.5 mg by mouth once a week.   montelukast 10 MG tablet Commonly known as: SINGULAIR TAKE ONE TABLET ONCE DAILY TO PREVENT AS DIRECTED   ondansetron 4 MG tablet Commonly known as: ZOFRAN Take 1 tablet (4 mg total) by mouth every 6 (six) hours as needed for nausea or vomiting.   prednisoLONE acetate 1 % ophthalmic suspension Commonly known as: PRED FORTE 1 drop every other day.   Simponi 50 MG/0.5ML Soaj Generic drug: Golimumab Inject 0.5 mLs into the skin every 28 (twenty-eight) days.    Past Medical History:  Diagnosis Date   Crohn disease ([place]) 03/22/2019   IBS (irritable bowel syndrome) 05/13/2021   Rheumatoid arthritis ([place])     Past Surgical History:  Procedure Laterality Date   CATARACT EXTRACTION Right    COLONOSCOPY     GLAUCOMA SURGERY      Review of systems negative except as noted in HPI / PMHx or noted below:  Review of Systems  Constitutional: Negative.   HENT: Negative.    Eyes: Negative.   Respiratory: Negative.    Cardiovascular: Negative.   Gastrointestinal: Negative.   Genitourinary: Negative.  Musculoskeletal: Negative.   Skin: Negative.   Neurological: Negative.   Endo/Heme/Allergies: Negative.   Psychiatric/Behavioral: Negative.       Objective:   Vitals:   07/29/22 1519  BP: 110/70  Pulse: 80  Resp: 16  Temp: 98.2 F (36.8 C)  SpO2: 97%   Height: 5' 3"$  (160 cm)  Weight: 132 lb (59.9 kg)   Physical Exam Constitutional:      Appearance: She is not diaphoretic.  HENT:     Head: Normocephalic.     Right Ear: Tympanic membrane, ear canal and external ear normal.     Left Ear:  Tympanic membrane, ear canal and external ear normal.     Nose: Nose normal. No mucosal edema or rhinorrhea.     Mouth/Throat:     Pharynx: Uvula midline. No oropharyngeal exudate.  Eyes:     Conjunctiva/sclera: Conjunctivae normal.  Neck:     Thyroid: No thyromegaly.     Trachea: Trachea normal. No tracheal tenderness or tracheal deviation.  Cardiovascular:     Rate and Rhythm: Normal rate and regular rhythm.     Heart sounds: Normal heart sounds, S1 normal and S2 normal. No murmur heard. Pulmonary:     Effort: No respiratory distress.     Breath sounds: Normal breath sounds. No stridor. No wheezing or rales.  Lymphadenopathy:     Head:     Right side of head: No tonsillar adenopathy.     Left side of head: No tonsillar adenopathy.     Cervical: No cervical adenopathy.  Skin:    Findings: No erythema or rash.     Nails: There is no clubbing.  Neurological:     Mental Status: She is alert.     Diagnostics: none  Assessment and Plan:   1. Perennial allergic rhinitis   2. Seasonal allergic rhinitis due to pollen   3. Allergic urticaria   4. Immunosuppression ([place])    1.  Continue to perform allergen avoidance measures - dog, dust mite, pollen, mold  2.  Continue to treat and prevent inflammation:   A. Montelukast 10 mg - 1 tablet 1 time per day  B. OTC Nasacort - 1-2 sprays each nostril 3-7 times per week  3. If needed:   A. OTC antihistamine - allegra / zyrtec / claritin  4. "Stress Steroids"  5. Return to clinic in 1 year or earlier if problem  Sydney Zimmerman will hopefully do well as she goes through the spring season using montelukast and Nasacort.  If she has difficulty as she goes through this spring she can contact me and we can always start her on immunotherapy.  She recently just stopped her prolonged use of systemic steroids December 2023 and I had a talk with her today about the need for stress steroids should she get into a situation where she has a big stressor  and requires a big adrenal cortisol response.  If she does well I will see her back in this clinic in 1 year or earlier if there is a problem.  Allena Katz, MD Allergy / [facility] [place]

## 2022-07-30 ENCOUNTER — Encounter: Payer: Self-pay | Admitting: Allergy and Immunology

## 2022-08-12 ENCOUNTER — Other Ambulatory Visit: Payer: Self-pay

## 2022-08-12 ENCOUNTER — Encounter (HOSPITAL_COMMUNITY): Payer: Self-pay | Admitting: Emergency Medicine

## 2022-08-12 ENCOUNTER — Emergency Department (HOSPITAL_COMMUNITY)
Admission: EM | Admit: 2022-08-12 | Discharge: 2022-08-13 | Disposition: A | Discharge status: Home / Self Care | Payer: BC Managed Care – PPO | Attending: Emergency Medicine | Admitting: Emergency Medicine

## 2022-08-12 DIAGNOSIS — N898 Other specified noninflammatory disorders of vagina: Secondary | ICD-10-CM

## 2022-08-12 DIAGNOSIS — B379 Candidiasis, unspecified: Secondary | ICD-10-CM | POA: Diagnosis not present

## 2022-08-12 DIAGNOSIS — N899 Noninflammatory disorder of vagina, unspecified: Secondary | ICD-10-CM | POA: Insufficient documentation

## 2022-08-12 LAB — URINALYSIS, ROUTINE W REFLEX MICROSCOPIC
Glucose, UA: NEGATIVE mg/dL
Ketones, ur: 15 mg/dL — AB
Leukocytes,Ua: NEGATIVE
Nitrite: NEGATIVE
Protein, ur: NEGATIVE mg/dL
Specific Gravity, Urine: >1.03 — ABNORMAL HIGH (ref 1.005–1.030)
pH: 6 (ref 5.0–8.0)

## 2022-08-12 LAB — URINALYSIS, MICROSCOPIC (REFLEX): Bacteria, UA: NONE SEEN

## 2022-08-12 LAB — I-STAT BETA HCG BLOOD, ED (MC, WL, AP ONLY): I-stat hCG, quantitative: <5 m[IU]/mL (ref <5)

## 2022-08-12 NOTE — ED Triage Notes (Signed)
Pt reports vaginal discharge X2-3 days that started as "pinkish but is now brownish."  NO pain w/ urination, no fevers, no discomfort at all. Reports wife is experiencing the same symptoms. Marland Kitchen

## 2022-08-12 NOTE — ED Provider Notes (Signed)
[facility] Provider Note   CSN: [id number] Arrival date & time: 08/12/22  2234     History  Chief Complaint  Patient presents with   Vaginal Discharge    Sydney Zimmerman is a 27 y.o. female.  The history is provided by the patient and medical records.  Vaginal Discharge  27 y.o. F here with vaginal discharge.  This began 2 days ago, initially pink but now seems more brown.  Denies dysuria, vaginal itching, abdominal pain, etc.  No fever/chills.  States wife currently with similar symptoms.  Would like STD testing.  Home Medications Prior to Admission medications   Medication Sig Start Date End Date Taking? Authorizing Provider  acetaminophen (TYLENOL) 650 MG CR tablet Take 1,300 mg by mouth every 8 (eight) hours as needed for pain.    [provider]  amphetamine-dextroamphetamine (ADDERALL) 20 MG tablet Take 20 mg by mouth daily. 05/14/22   [provider]  dicyclomine (BENTYL) 20 MG tablet TAKE 1 TABLET BY MOUTH EVERY 6 HOURS 05/06/21   Daryel November, MD  diphenoxylate-atropine (LOMOTIL) 2.5-0.025 MG tablet TAKE 1 TABLET 4 TIMES DAILY AS NEEDED FOR DIARRHEA OR LOOSE STOLLS 04/07/22   Daryel November, MD  folic acid (FOLVITE) 1 MG tablet Take 2 tablets by mouth daily at 12 noon. 02/08/21   [provider]  Golimumab (SIMPONI) 50 MG/0.5ML SOAJ Inject 0.5 mLs into the skin every 28 (twenty-eight) days. 01/07/19   [provider]  ketorolac (ACULAR) 0.4 % SOLN Place 1 drop into the right eye 4 (four) times daily. 05/29/21   [provider]  methotrexate (RHEUMATREX) 2.5 MG tablet Take 2.5 mg by mouth once a week. 05/14/22   [provider]  montelukast (SINGULAIR) 10 MG tablet TAKE ONE TABLET ONCE DAILY TO PREVENT AS DIRECTED 01/27/22   Kozlow, Donnamarie Poag, MD  ondansetron (ZOFRAN) 4 MG tablet Take 1 tablet (4 mg total) by mouth every 6 (six) hours as needed for nausea or vomiting.  01/28/22   Daryel November, MD  prednisoLONE acetate (PRED FORTE) 1 % ophthalmic suspension 1 drop every other day.    [provider]      Allergies    Sulfamethoxazole-trimethoprim, Ibuprofen, Imodium [loperamide], Sulfamethoxazole, and Adalimumab    Review of Systems   Review of Systems  Genitourinary:  Positive for vaginal discharge.  All other systems reviewed and are negative.   Physical Exam Updated Vital Signs BP (!) 144/83   Pulse 77   Temp 98.7 F (37.1 C) (Oral)   Resp 17   Ht '5\' 3"'$  (1.6 m)   Wt 59 kg   SpO2 100%   BMI 23.03 kg/m  Physical Exam Vitals and nursing note reviewed.  Constitutional:      Appearance: She is well-developed.  HENT:     Head: Normocephalic and atraumatic.  Eyes:     Conjunctiva/sclera: Conjunctivae normal.     Pupils: Pupils are equal, round, and reactive to light.  Cardiovascular:     Rate and Rhythm: Normal rate and regular rhythm.     Heart sounds: Normal heart sounds.  Pulmonary:     Effort: Pulmonary effort is normal.     Breath sounds: Normal breath sounds.  Abdominal:     General: Bowel sounds are normal.     Palpations: Abdomen is soft.  Genitourinary:    Comments: Self swabs performed Musculoskeletal:        General: Normal range of motion.  Cervical back: Normal range of motion.  Skin:    General: Skin is warm and dry.  Neurological:     Mental Status: She is alert and oriented to person, place, and time.     ED Results / Procedures / Treatments   Labs (all labs ordered are listed, but only abnormal results are displayed) Labs Reviewed  WET PREP, GENITAL - Abnormal; Notable for the following components:      Result Value   Yeast Wet Prep HPF POC PRESENT (*)    WBC, Wet Prep HPF POC >=10 (*)    All other components within normal limits  URINALYSIS, ROUTINE W REFLEX MICROSCOPIC - Abnormal; Notable for the following components:   Specific Gravity, Urine >1.030 (*)    Hgb urine dipstick SMALL  (*)    Bilirubin Urine SMALL (*)    Ketones, ur 15 (*)    All other components within normal limits  URINALYSIS, MICROSCOPIC (REFLEX)  I-STAT BETA HCG BLOOD, ED ([facility], [facility], [facility] ONLY)  GC/CHLAMYDIA PROBE AMP ([place]) NOT AT [facility]    EKG None  Radiology No results found.  Procedures Procedures    Medications Ordered in ED Medications  fluconazole (DIFLUCAN) tablet 150 mg (has no administration in time range)    ED Course/ Medical Decision Making/ A&P                             Medical Decision Making Amount and/or Complexity of Data Reviewed Labs: ordered. ECG/medicine tests: ordered and independent interpretation performed.  Risk Prescription drug management.   27 year old female presenting to the ED with vaginal discharge over the past 2 days.  Wife currently with similar.  Denies any abdominal pain, vaginal itching, or vaginal pain.  UA without any signs of infection.  Wet prep with yeast.  Gc/chl pending.  Treated here with diflucan.  Advised she will be notified if STD testing is positive.  Can follow-up with PCP.  Return here for new concerns.  Final Clinical Impression(s) / ED Diagnoses Final diagnoses:  Vaginal discharge  Yeast infection    Rx / DC Orders ED Discharge Orders     None         Larene Pickett, PA-C 08/13/22 0135    Maudie Flakes, MD 08/15/22 [phone]

## 2022-08-13 LAB — GC/CHLAMYDIA PROBE AMP (~~LOC~~) NOT AT ARMC
Chlamydia: NEGATIVE
Comment: NEGATIVE
Comment: NORMAL
Neisseria Gonorrhea: NEGATIVE

## 2022-08-13 LAB — WET PREP, GENITAL
Clue Cells Wet Prep HPF POC: NONE SEEN
Sperm: NONE SEEN
Trich, Wet Prep: NONE SEEN
WBC, Wet Prep HPF POC: >=10 — AB (ref <10)

## 2022-08-13 MED ORDER — FLUCONAZOLE 150 MG PO TABS
150.0000 mg | ORAL_TABLET | Freq: Once | ORAL | Status: AC
  Administered 2022-08-13: 150 mg via ORAL
  Filled 2022-08-13: qty 1

## 2022-08-13 NOTE — Discharge Instructions (Addendum)
You tested positive for yeast today.  We treated you here with diflucan. You will be notified via phone if STD cultures are positive. Follow-up with your primary care doctor. Return here for new concerns.

## 2022-08-20 ENCOUNTER — Other Ambulatory Visit: Payer: Self-pay | Admitting: Allergy and Immunology

## 2022-10-13 ENCOUNTER — Encounter (HOSPITAL_COMMUNITY): Payer: Self-pay

## 2022-10-13 ENCOUNTER — Other Ambulatory Visit: Payer: Self-pay

## 2022-10-13 ENCOUNTER — Emergency Department (HOSPITAL_COMMUNITY)
Admission: EM | Admit: 2022-10-13 | Discharge: 2022-10-13 | Disposition: A | Discharge status: Home / Self Care | Payer: BC Managed Care – PPO | Attending: Emergency Medicine | Admitting: Emergency Medicine

## 2022-10-13 DIAGNOSIS — H748X3 Other specified disorders of middle ear and mastoid, bilateral: Secondary | ICD-10-CM | POA: Insufficient documentation

## 2022-10-13 DIAGNOSIS — J069 Acute upper respiratory infection, unspecified: Secondary | ICD-10-CM | POA: Diagnosis not present

## 2022-10-13 DIAGNOSIS — Z1152 Encounter for screening for COVID-19: Secondary | ICD-10-CM | POA: Insufficient documentation

## 2022-10-13 DIAGNOSIS — R519 Headache, unspecified: Secondary | ICD-10-CM | POA: Diagnosis present

## 2022-10-13 HISTORY — DX: Attention-deficit hyperactivity disorder, unspecified type: F90.9

## 2022-10-13 HISTORY — DX: Unspecified iridocyclitis: H20.9

## 2022-10-13 LAB — SARS CORONAVIRUS 2 BY RT PCR: SARS Coronavirus 2 by RT PCR: NEGATIVE

## 2022-10-13 MED ORDER — FLUTICASONE PROPIONATE 50 MCG/ACT NA SUSP: 1.0000 | Freq: Every day | NASAL | 0 refills | Status: DC

## 2022-10-13 MED ORDER — FLUTICASONE PROPIONATE 50 MCG/ACT NA SUSP: 1.0000 | Freq: Every day | NASAL | 0 refills | Status: AC

## 2022-10-13 NOTE — ED Triage Notes (Signed)
Pt reports sinus congestion, headache, some dizziness and nausea since Saturday.

## 2022-10-13 NOTE — ED Provider Notes (Signed)
[place] [facility]MENT AT [facility] Provider Note   CSN: [id number] Arrival date & time: 10/13/22  1535     History  Chief Complaint  Patient presents with   Nasal Congestion   Headache    Sydney Zimmerman is a 27 y.o. female. She reports history of RA.  She presents the ER today complaining of sinus congestion for the past 2 to 3 days with a feeling of popping in the right ear and intermittent sore throat, no vomiting, no shortness of breath or chest pain, she does have mild cough.   Headache      Home Medications Prior to Admission medications   Medication Sig Start Date End Date Taking? Authorizing Provider  acetaminophen (TYLENOL) 650 MG CR tablet Take 1,300 mg by mouth every 8 (eight) hours as needed for pain.    [provider]  amphetamine-dextroamphetamine (ADDERALL) 20 MG tablet Take 20 mg by mouth daily. 05/14/22   [provider]  dicyclomine (BENTYL) 20 MG tablet TAKE 1 TABLET BY MOUTH EVERY 6 HOURS 05/06/21   Jenel Lucks, MD  diphenoxylate-atropine (LOMOTIL) 2.5-0.025 MG tablet TAKE 1 TABLET 4 TIMES DAILY AS NEEDED FOR DIARRHEA OR LOOSE STOLLS 04/07/22   Jenel Lucks, MD  fluticasone (FLONASE) 50 MCG/ACT nasal spray Place 1 spray into both nostrils daily. 10/13/22   Carmel Sacramento A, PA-C  folic acid (FOLVITE) 1 MG tablet Take 2 tablets by mouth daily at 12 noon. 02/08/21   [provider]  Golimumab (SIMPONI) 50 MG/0.5ML SOAJ Inject 0.5 mLs into the skin every 28 (twenty-eight) days. 01/07/19   [provider]  ketorolac (ACULAR) 0.4 % SOLN Place 1 drop into the right eye 4 (four) times daily. 05/29/21   [provider]  methotrexate (RHEUMATREX) 2.5 MG tablet Take 2.5 mg by mouth once a week. 05/14/22   [provider]  montelukast (SINGULAIR) 10 MG tablet TAKE ONE TABLET ONCE DAILY TO PREVENT AS DIRECTED 08/20/22   Kozlow, Alvira Philips, MD  ondansetron (ZOFRAN) 4 MG tablet Take 1  tablet (4 mg total) by mouth every 6 (six) hours as needed for nausea or vomiting. 01/28/22   Jenel Lucks, MD  prednisoLONE acetate (PRED FORTE) 1 % ophthalmic suspension 1 drop every other day.    [provider]      Allergies    Sulfamethoxazole-trimethoprim, Ibuprofen, Imodium [loperamide], Sulfamethoxazole, and Adalimumab    Review of Systems   Review of Systems  Neurological:  Positive for headaches.    Physical Exam Updated Vital Signs BP (!) 131/96   Pulse 97   Temp 99.1 F (37.3 C)   Resp 20   SpO2 100%  Physical Exam Vitals and nursing note reviewed.  Constitutional:      General: She is not in acute distress.    Appearance: She is well-developed.  HENT:     Head: Normocephalic and atraumatic.     Right Ear: A middle ear effusion is present. No mastoid tenderness. No hemotympanum. Tympanic membrane is not injected or erythematous.     Left Ear: A middle ear effusion is present. No mastoid tenderness. No hemotympanum. Tympanic membrane is not injected or erythematous.     Mouth/Throat:     Lips: Pink.     Mouth: Mucous membranes are moist.     Pharynx: Oropharynx is clear.  Eyes:     Conjunctiva/sclera: Conjunctivae normal.  Cardiovascular:     Rate and Rhythm: Normal rate and regular rhythm.  Heart sounds: No murmur heard. Pulmonary:     Effort: Pulmonary effort is normal. No respiratory distress.     Breath sounds: Normal breath sounds.  Abdominal:     Palpations: Abdomen is soft.     Tenderness: There is no abdominal tenderness.  Musculoskeletal:        General: No swelling.     Cervical back: Neck supple.  Skin:    General: Skin is warm and dry.     Capillary Refill: Capillary refill takes less than 2 seconds.  Neurological:     Mental Status: She is alert.  Psychiatric:        Mood and Affect: Mood normal.     ED Results / Procedures / Treatments   Labs (all labs ordered are listed, but only abnormal results are  displayed) Labs Reviewed  SARS CORONAVIRUS 2 BY RT PCR    EKG None  Radiology No results found.  Procedures Procedures    Medications Ordered in ED Medications - No data to display  ED Course/ Medical Decision Making/ A&P                             Medical Decision Making Ddx: Rhinitis, acute sinusitis, tonsillitis, viral URI, other ED course: Patient presents with couple days of symptoms of congestion, right ear popping, no fevers, no vomiting, reassuring exam.  Does have bilateral TM effusions.  No signs of otitis media.    COVID swab was ordered and patient was to follow-up with Supple on her MyChart.  I discussed with the possibility of prescribing Paxlovid if possible but she states she would not want to take this.  Ultimately test was negative upon further review, she is well-appearing well-hydrated, the prescribed Flonase to help with congestion and eustachian tube dysfunction advised on follow-up and return precautions.           Final Clinical Impression(s) / ED Diagnoses Final diagnoses:  Viral upper respiratory tract infection    Rx / DC Orders ED Discharge Orders          Ordered    fluticasone (FLONASE) 50 MCG/ACT nasal spray  Daily,   Status:  Discontinued        10/13/22 1646    fluticasone (FLONASE) 50 MCG/ACT nasal spray  Daily        10/13/22 [address], PA-C 10/13/22 1939    Loetta Rough, MD 11/05/22 1547

## 2022-10-13 NOTE — Discharge Instructions (Addendum)
Follow-up with your Covid swab on MyChart.  Come back to the ER for new or worsening symptoms.  Plenty of fluids and rest.  The Flonase will help with your congestion and ear fullness.  You can try taking Zyrtec over-the-counter as well.

## 2022-11-17 ENCOUNTER — Other Ambulatory Visit (INDEPENDENT_AMBULATORY_CARE_PROVIDER_SITE_OTHER): Payer: BC Managed Care – PPO

## 2022-11-17 ENCOUNTER — Encounter: Payer: Self-pay | Admitting: Gastroenterology

## 2022-11-17 ENCOUNTER — Other Ambulatory Visit: Payer: Self-pay

## 2022-11-17 DIAGNOSIS — K625 Hemorrhage of anus and rectum: Secondary | ICD-10-CM

## 2022-11-17 LAB — CBC WITH DIFFERENTIAL/PLATELET
Basophils Absolute: 0.1 10*3/uL (ref 0.0–0.1)
Basophils Relative: 0.5 % (ref 0.0–3.0)
Eosinophils Absolute: 0.2 10*3/uL (ref 0.0–0.7)
Eosinophils Relative: 1.6 % (ref 0.0–5.0)
HCT: 36.1 % (ref 36.0–46.0)
Hemoglobin: 11.8 g/dL — ABNORMAL LOW (ref 12.0–15.0)
Lymphocytes Relative: 24.9 % (ref 12.0–46.0)
Lymphs Abs: 2.9 10*3/uL (ref 0.7–4.0)
MCHC: 32.7 g/dL (ref 30.0–36.0)
MCV: 88.3 fl (ref 78.0–100.0)
Monocytes Absolute: 0.8 10*3/uL (ref 0.1–1.0)
Monocytes Relative: 7.4 % (ref 3.0–12.0)
Neutro Abs: 7.5 10*3/uL (ref 1.4–7.7)
Neutrophils Relative %: 65.6 % (ref 43.0–77.0)
Platelets: 273 10*3/uL (ref 150.0–400.0)
RBC: 4.09 Mil/uL (ref 3.87–5.11)
RDW: 14.5 % (ref 11.5–15.5)
WBC: 11.5 10*3/uL — ABNORMAL HIGH (ref 4.0–10.5)

## 2022-11-17 LAB — C-REACTIVE PROTEIN: CRP: <1 mg/dL (ref 0.5–20.0)

## 2022-11-17 MED ORDER — HYDROCORTISONE ACETATE 25 MG RE SUPP: 25.0000 mg | Freq: Two times a day (BID) | RECTAL | 1 refills | Status: DC

## 2022-11-18 LAB — FECAL LACTOFERRIN, QUANT
Fecal Lactoferrin: POSITIVE — AB
MICRO NUMBER:: 15033028
SPECIMEN QUALITY:: ADEQUATE

## 2022-11-19 NOTE — Progress Notes (Signed)
I spoke with Ms. Sydney Zimmerman today to get a better sense of her symptoms.  She tells me that she she was having some constipation for few days, and then started noticing bright red blood per rectum, which is continued for the past week or so.  She denies any other symptoms such as abdominal pain, diarrhea, nausea/vomiting, fever/chills, urgency, tenesmus or nocturnal stools.  She is having regular bowel movements now, typically 1 to 2/day with mostly formed stools. Her symptoms do not seem very consistent with an infectious colitis or inflammatory bowel disease.  Her description of her symptoms seem most consistent with hemorrhoidal bleeding.  I am not inclined to commit her to a repeat colonoscopy based on the positive fecal lactoferrin, given her lack of other symptoms. I recommended that she start taking the Anusol (she did not start taking it because it was going to cost $92, I stated that she can get this over-the-counter and that will likely be cheaper) and to continue taking Metamucil.  If her symptoms or not improving over the next 1 to 2 weeks, then we can reconsider colonoscopy.  Also, if she were to develop any of the above-mentioned symptoms, I would recommend a colonoscopy at that point as well.

## 2023-02-18 ENCOUNTER — Ambulatory Visit (INDEPENDENT_AMBULATORY_CARE_PROVIDER_SITE_OTHER): Payer: BC Managed Care – PPO | Admitting: Gastroenterology

## 2023-02-18 ENCOUNTER — Encounter: Payer: Self-pay | Admitting: Gastroenterology

## 2023-02-18 VITALS — BP 104/68 | HR 67 | Ht 63.0 in | Wt 123.8 lb

## 2023-02-18 DIAGNOSIS — K589 Irritable bowel syndrome without diarrhea: Secondary | ICD-10-CM

## 2023-02-18 DIAGNOSIS — K640 First degree hemorrhoids: Secondary | ICD-10-CM | POA: Diagnosis not present

## 2023-02-18 DIAGNOSIS — K649 Unspecified hemorrhoids: Secondary | ICD-10-CM

## 2023-02-18 NOTE — Patient Instructions (Signed)
Continue Metamucil daily.  If your blood pressure at your visit was 140/90 or greater, please contact your primary care physician to follow up on this.  _______________________________________________________  If you are age 26 or older, your body mass index should be between 23-30. Your Body mass index is 21.93 kg/m. If this is out of the aforementioned range listed, please consider follow up with your Primary Care Provider.  If you are age 98 or younger, your body mass index should be between 19-25. Your Body mass index is 21.93 kg/m. If this is out of the aformentioned range listed, please consider follow up with your Primary Care Provider.   ________________________________________________________  The [facility] providers would like to encourage you to use [facility] to communicate with providers for non-urgent requests or questions.  Due to long hold times on the telephone, sending your provider a message by [facility] may be a faster and more efficient way to get a response.  Please allow 48 business hours for a response.  Please remember that this is for non-urgent requests.   It was a pleasure to see you today!  Thank you for trusting me with your gastrointestinal care!    Scott E.Tomasa Rand, MD

## 2023-02-18 NOTE — Progress Notes (Unsigned)
HPI : Sydney Zimmerman is a very pleasant 27 year old female who I initially saw September 2022.  She had initially been diagnosed with Crohn's disease, but this was subsequently really classified as irritable bowel syndrome.  She does have rheumatoid arthritis and chronic uveitis and is followed by rheumatology and ophthalmology.  At her last visit in Aug 2023 she had been having flare symptoms of her IBS with abdominal pain and diarrhea.  She was treated with Bentyl and imodium and her symptoms improved.   In late May/early June she was experiencing rectal bleeding for several days. She was advised to take Metamucil daily and to use hydrocortisone suppositories as needed for up to 14 days to reduce hemorrhoidal swelling.  CBC and CRP were unremarkable, but fecal lactoferrin was positive. Today, she states that she is doing well and has no complaints or concerns.   She started taking the Metamucil as directed and the bleeding resolved within a couple of days.  She never took the suppositories. She is currently having 2-3 bowel movements per day with formed, soft stools.  No problems with abdominal pain recently.  No other hemorrhoidal symptoms such as perianal pain/swelling, itching/burning, prolapse or seepage.  She has been having ongoing issues with her arthritis and has been placed back on Plaquenil and another steroid taper.  She continues on Simponi and MTX   Past Medical History:  Diagnosis Date   ADHD    Crohn disease (HCC) 03/22/2019   IBS (irritable bowel syndrome) 05/13/2021   Rheumatoid arthritis (HCC)    Uveitis      Past Surgical History:  Procedure Laterality Date   CATARACT EXTRACTION Right    COLONOSCOPY     GLAUCOMA SURGERY     Family History  Problem Relation Age of Onset   Colon polyps Mother    Colon polyps Maternal Grandmother    Esophageal cancer Maternal Grandmother    Diabetes Maternal Grandmother    Colon cancer Neg Hx    Stomach cancer Neg Hx     Social History   Tobacco Use   Smoking status: Never   Smokeless tobacco: Never  Substance Use Topics   Alcohol use: Yes   Drug use: Not Currently   Current Outpatient Medications  Medication Sig Dispense Refill   acetaminophen (TYLENOL) 650 MG CR tablet Take 1,300 mg by mouth every 8 (eight) hours as needed for pain.     busPIRone (BUSPAR) 7.5 MG tablet Take 7.5 mg by mouth 2 (two) times daily.     dicyclomine (BENTYL) 20 MG tablet TAKE 1 TABLET BY MOUTH EVERY 6 HOURS 30 tablet 1   diphenoxylate-atropine (LOMOTIL) 2.5-0.025 MG tablet TAKE 1 TABLET 4 TIMES DAILY AS NEEDED FOR DIARRHEA OR LOOSE STOLLS 30 tablet 0   fluticasone (FLONASE) 50 MCG/ACT nasal spray Place 1 spray into both nostrils daily. 11.1 mL 0   folic acid (FOLVITE) 1 MG tablet Take 2 tablets by mouth daily at 12 noon.     Golimumab (SIMPONI) 50 MG/0.5ML SOAJ Inject 0.5 mLs into the skin every 28 (twenty-eight) days.     hydroxychloroquine (PLAQUENIL) 200 MG tablet Take 200 mg by mouth daily.     methotrexate (RHEUMATREX) 2.5 MG tablet Take 2.5 mg by mouth once a week.     montelukast (SINGULAIR) 10 MG tablet TAKE ONE TABLET ONCE DAILY TO PREVENT AS DIRECTED 90 tablet 1   ondansetron (ZOFRAN) 4 MG tablet Take 1 tablet (4 mg total) by mouth every 6 (six) hours as needed  for nausea or vomiting. 30 tablet 1   prednisoLONE acetate (PRED FORTE) 1 % ophthalmic suspension 1 drop every other day.     predniSONE (DELTASONE) 5 MG tablet Take 10 mg by mouth daily with breakfast. Prednisone taper     No current facility-administered medications for this visit.   Allergies  Allergen Reactions   Sulfamethoxazole-Trimethoprim Hives and Itching    Patient developed urticarial rash 3-4 days after starting Bactrim    Ibuprofen    Imodium [Loperamide] Swelling   Sulfamethoxazole Itching   Adalimumab Rash     Review of Systems: All systems reviewed and negative except where noted in HPI.    No results found.  Physical  Exam: BP 104/68   Pulse 67   Ht 5\' 3"  (1.6 m)   Wt 123 lb 12.8 oz (56.2 kg)   LMP 01/30/2023   BMI 21.93 kg/m  Constitutional: Pleasant,well-developed, African American female in no acute distress. Skin: Skin is warm and dry. No rashes noted. Psychiatric: Normal mood and affect. Behavior is normal.  CBC    Component Value Date/Time   WBC 11.5 (H) 11/17/2022 1641   RBC 4.09 11/17/2022 1641   HGB 11.8 (L) 11/17/2022 1641   HCT 36.1 11/17/2022 1641   PLT 273.0 11/17/2022 1641   MCV 88.3 11/17/2022 1641   MCH 26.7 03/22/2019 0744   MCHC 32.7 11/17/2022 1641   RDW 14.5 11/17/2022 1641   LYMPHSABS 2.9 11/17/2022 1641   MONOABS 0.8 11/17/2022 1641   EOSABS 0.2 11/17/2022 1641   BASOSABS 0.1 11/17/2022 1641    CMP     Component Value Date/Time   NA 138 03/22/2019 0744   K 3.8 03/22/2019 0744   CL 108 03/22/2019 0744   CO2 21 (L) 03/22/2019 0744   GLUCOSE 106 (H) 03/22/2019 0744   BUN 6 03/22/2019 0744   CREATININE 0.62 03/22/2019 0744   CALCIUM 8.2 (L) 03/22/2019 0744   PROT 7.3 03/14/2021 1007   ALBUMIN 3.9 03/14/2021 1007   AST 10 03/14/2021 1007   ALT 12 03/14/2021 1007   ALKPHOS 41 03/14/2021 1007   BILITOT 0.2 03/14/2021 1007   GFRNONAA >60 03/22/2019 0744   GFRAA >60 03/22/2019 0744       Latest Ref Rng & Units 11/17/2022    4:41 PM 03/22/2019    7:44 AM 03/21/2019   10:00 PM  CBC EXTENDED  WBC 4.0 - 10.5 K/uL 11.5  6.8  9.4   RBC 3.87 - 5.11 Mil/uL 4.09  4.08  4.75   Hemoglobin 12.0 - 15.0 g/dL 40.9  81.1  91.4   HCT 36.0 - 46.0 % 36.1  33.4  38.6   Platelets 150.0 - 400.0 K/uL 273.0  394  468   NEUT# 1.4 - 7.7 K/uL 7.5  5.5  6.2   Lymph# 0.7 - 4.0 K/uL 2.9  0.8  1.8       ASSESSMENT AND PLAN:  27 year old female, with juvenile RA and uveitis, initially diagnosed with Crohn's disease but subsequently reclassified to IBS, with recent bout of painless hematochezia lasting several days, resolving with addition of Metamucil.  She has had hematochezia in  the past and has undergone multiple colonoscopies (see office note Sept 2022 for summary).  Although her fecal lactoferrin was positive, her symptoms resolved so quickly, I think there is very little chance her symptoms were related to IBD.   Currently without any GI symptoms.   I recommended she continue the metamucil daily to maintain optimal stool  consistency and reduce hemorrhoidal symptoms. We discussed the role of hemorrhoid banding if she has persistent symptoms despite conservative management.  Grade I internal hemorrhoids - Continue daily metamucil - PRN anusol - Banding discussed  IBS - Currently asymptomatic - Follow up as needed  Taison Celani E. Tomasa Rand, MD [facility]    No ref. provider found

## 2023-04-11 ENCOUNTER — Other Ambulatory Visit: Payer: Self-pay | Admitting: Allergy and Immunology

## 2023-04-27 ENCOUNTER — Encounter: Payer: Self-pay | Admitting: Allergy and Immunology

## 2023-05-06 ENCOUNTER — Other Ambulatory Visit: Payer: Self-pay

## 2023-05-06 MED ORDER — MONTELUKAST SODIUM 10 MG PO TABS: ORAL_TABLET | ORAL | 1 refills | Status: DC

## 2023-05-07 ENCOUNTER — Other Ambulatory Visit: Payer: Self-pay | Admitting: *Deleted

## 2023-05-07 ENCOUNTER — Other Ambulatory Visit: Payer: Self-pay | Admitting: Gastroenterology

## 2024-03-07 ENCOUNTER — Encounter: Admitting: Obstetrics and Gynecology

## 2024-03-23 ENCOUNTER — Encounter: Payer: Self-pay | Admitting: Physician Assistant

## 2024-03-23 ENCOUNTER — Other Ambulatory Visit (HOSPITAL_COMMUNITY)
Admission: RE | Admit: 2024-03-23 | Discharge: 2024-03-23 | Disposition: A | Discharge status: Home / Self Care | Source: Ambulatory Visit | Attending: Physician Assistant | Admitting: Physician Assistant

## 2024-03-23 ENCOUNTER — Ambulatory Visit (INDEPENDENT_AMBULATORY_CARE_PROVIDER_SITE_OTHER): Admitting: Physician Assistant

## 2024-03-23 VITALS — BP 133/80 | HR 78 | Ht 63.0 in | Wt 123.1 lb

## 2024-03-23 DIAGNOSIS — Z124 Encounter for screening for malignant neoplasm of cervix: Secondary | ICD-10-CM | POA: Diagnosis present

## 2024-03-23 DIAGNOSIS — Z1331 Encounter for screening for depression: Secondary | ICD-10-CM

## 2024-03-23 DIAGNOSIS — Z113 Encounter for screening for infections with a predominantly sexual mode of transmission: Secondary | ICD-10-CM | POA: Diagnosis present

## 2024-03-23 DIAGNOSIS — Z01419 Encounter for gynecological examination (general) (routine) without abnormal findings: Secondary | ICD-10-CM

## 2024-03-23 NOTE — Progress Notes (Signed)
 Pt presents for annual. Pt states that she is having irregular periods. No other questions or concerns at this time. PHQ-9 is 6 and GAD-7 is 15

## 2024-03-23 NOTE — Progress Notes (Signed)
 ANNUAL EXAM Patient name: Sydney Zimmerman MRN [id number]  Date of birth: [birth year] Chief Complaint:   New Patient (Initial Visit)  History of Present Illness:   Sydney Zimmerman is a 28 y.o. No obstetric history on file. female being seen today for a routine annual exam.   Current complaints: None  Patient's last menstrual period was 03/01/2024 (exact date).   The pregnancy intention screening data noted above was reviewed. Potential methods of contraception were discussed. The patient elected to proceed with No data recorded.   Last pap: Before COVID. H/O abnormal pap: no Last mammogram: Never previously done due to age. Results were: N/A. Family h/o breast cancer: yes maternal grandmother and great aunt Last colonoscopy: patient reports 3 years [facility]. Not found in Care Everywhere. She does follow with GI due to Crohn's. Family h/o colorectal cancer: no STI screening: Accepts Contraception: None, female partner     03/23/2024    2:36 PM  Depression screen PHQ 2/9  Decreased Interest 1  Down, Depressed, Hopeless 1  PHQ - 2 Score 2  Altered sleeping 0  Tired, decreased energy 3  Change in appetite 0  Feeling bad or failure about yourself  0  Trouble concentrating 1  Moving slowly or fidgety/restless 0  Suicidal thoughts 0  PHQ-9 Score 6        03/23/2024    2:37 PM  GAD 7 : Generalized Anxiety Score  Nervous, Anxious, on Edge 2  Control/stop worrying 3  Worry too much - different things 3  Trouble relaxing 3  Restless 2  Easily annoyed or irritable 2  Afraid - awful might happen 0  Total GAD 7 Score 15     Review of Systems:   Pertinent items are noted in HPI Denies any headaches, blurred vision, fatigue, shortness of breath, chest pain, abdominal pain, abnormal vaginal discharge/itching/odor/irritation, problems with periods, bowel movements, urination, or intercourse unless otherwise stated above. Pertinent History Reviewed:  Reviewed past  medical,surgical, social and family history.  Reviewed problem list, medications and allergies. Physical Assessment:   Vitals:   03/23/24 1414  BP: 133/80  Pulse: 78  Weight: 123 lb 1.6 oz (55.8 kg)  Height: 5' 3 (1.6 m)  Body mass index is 21.81 kg/m.        Physical Examination:   General appearance - well appearing, and in no distress  Mental status - alert, oriented to person, place, and time  Psych:  She has a normal mood and affect  Skin - warm and dry, normal color, no suspicious lesions noted  Chest - effort normal, all lung fields clear to auscultation bilaterally  Heart - normal rate and regular rhythm  Neck:  midline trachea, no thyromegaly or nodules  Breasts - breasts appear normal, no suspicious masses, no skin or nipple changes or  axillary nodes  Abdomen - soft, nontender, nondistended, no masses or organomegaly  Pelvic - VULVA: normal appearing vulva with no masses, tenderness or lesions  VAGINA: normal appearing vagina with normal color and discharge, no lesions  CERVIX: normal appearing cervix without discharge or lesions, no CMT  Thin prep pap is done without HR HPV cotesting  UTERUS: uterus is felt to be normal size, shape, consistency and nontender   ADNEXA: No adnexal masses or tenderness noted.  Extremities:  No swelling or varicosities noted  Chaperone present for exam  No results found for this or any previous visit (from the past 24 hours).  Assessment & Plan:  1. Encounter for annual routine gynecological examination (Primary) - Cervical cancer screening: Discussed screening Q3 years. Reviewed importance of annual exams and limits of pap smear. Pap with reflex HPV updated today - GC/CT: Discussed and recommended. Pt accepts - Birth Control: None - Positive anxiety and depression screening- patient without SI/HI. Politely declines referral to [facility], as she has a therapist.   - Breast Health: Encouraged self breast awareness/exams. Teaching provided. -  Mammogram: @ 28yo, or sooner if problems - Colonoscopy: per GI, with history of Crohn's disease.  - Follow-up: 12 months and prn   2. Cervical cancer screening - Cytology - PAP( [place])  3. Routine screening for STI (sexually transmitted infection) - Cervicovaginal ancillary only( [place]) - HIV antibody (with reflex) - RPR - Hepatitis C Antibody - Hepatitis B Surface AntiGEN    Orders Placed This Encounter  Procedures   HIV antibody (with reflex)   RPR   Hepatitis C Antibody   Hepatitis B Surface AntiGEN    Meds: No orders of the defined types were placed in this encounter.   Follow-up: Return in about 1 year (around 03/23/2025) for ANN.  Davie Claud E Tiffane Sheldon, PA-C 03/23/2024 5:02 PM

## 2024-03-24 LAB — HEPATITIS B SURFACE ANTIGEN: Hepatitis B Surface Ag: NEGATIVE

## 2024-03-24 LAB — RPR: RPR Ser Ql: NONREACTIVE

## 2024-03-24 LAB — HIV ANTIBODY (ROUTINE TESTING W REFLEX): HIV Screen 4th Generation wRfx: NONREACTIVE

## 2024-03-24 LAB — HEPATITIS C ANTIBODY: Hep C Virus Ab: NONREACTIVE

## 2024-03-25 LAB — CERVICOVAGINAL ANCILLARY ONLY
Bacterial Vaginitis (gardnerella): NEGATIVE
Candida Glabrata: NEGATIVE
Candida Vaginitis: NEGATIVE
Chlamydia: NEGATIVE
Comment: NEGATIVE
Comment: NEGATIVE
Comment: NEGATIVE
Comment: NEGATIVE
Comment: NEGATIVE
Comment: NORMAL
Neisseria Gonorrhea: NEGATIVE
Trichomonas: NEGATIVE

## 2024-03-27 ENCOUNTER — Ambulatory Visit: Payer: Self-pay | Admitting: Physician Assistant

## 2024-03-30 LAB — CYTOLOGY - PAP: Diagnosis: NEGATIVE

## 2024-04-14 ENCOUNTER — Other Ambulatory Visit (HOSPITAL_COMMUNITY)
Admission: RE | Admit: 2024-04-14 | Discharge: 2024-04-14 | Disposition: A | Discharge status: Home / Self Care | Source: Ambulatory Visit | Attending: Physician Assistant | Admitting: Physician Assistant

## 2024-04-14 ENCOUNTER — Encounter: Payer: Self-pay | Admitting: Physician Assistant

## 2024-04-14 ENCOUNTER — Ambulatory Visit: Admitting: Physician Assistant

## 2024-04-14 VITALS — BP 113/69 | HR 70 | Ht 63.0 in | Wt 118.2 lb

## 2024-04-14 DIAGNOSIS — N939 Abnormal uterine and vaginal bleeding, unspecified: Secondary | ICD-10-CM | POA: Diagnosis present

## 2024-04-14 NOTE — Progress Notes (Signed)
 Pt c/o irregular periods. Pt wants conceive using IVF in the near future.

## 2024-04-14 NOTE — Progress Notes (Signed)
 GYNECOLOGY  VISIT   HPI: Sydney Zimmerman is a 28 y.o.  married female  [id number] presenting for irregular menses, which she has been documenting on a phone app. Periods seem to occur twice monthly and last 5-7 days, with variable, manageable flow and periodic intermenstrual spotting. There were 5 weeks between her last two cycles. Associated symptoms that occur with periods are 7/10 cramping, alleviated with tylenol  and vinegar. Also endorses menstrual diarrhea. Patient denies pelvic pain, dyspareunia, dyschezia, dysuria, urinary frequency/urgency.   Patient would also like referral to reproductive endocrinology, as she and female partner plan to use IVF to conceive in near future.   GYNECOLOGIC HISTORY: Patient's last menstrual period was 04/09/2024 (exact date). Contraception: none Menopausal hormone therapy: premenopausal Last mammogram: never done due to age Last pap smear:  Diagnosis  Date Value Ref Range Status  03/23/2024   Final   - Negative for intraepithelial lesion or malignancy (NILM)           OB History     Gravida  0   Para  0   Term  0   Preterm  0   AB  0   Living  0      SAB  0   IAB  0   Ectopic  0   Multiple  0   Live Births  0              Patient Active Problem List   Diagnosis Date Noted   Seasonal and perennial allergic rhinitis 08/16/2021   Seasonal allergic conjunctivitis 08/16/2021   Allergic urticaria 08/16/2021   Immunosuppression 08/16/2021   IBS (irritable bowel syndrome) 05/13/2021   SIRS (systemic inflammatory response syndrome) (HCC) 03/22/2019   Hypokalemia 03/22/2019   Juvenile rheumatoid arthritis (HCC) 03/22/2019    Past Medical History:  Diagnosis Date   ADHD    Crohn disease (HCC) 03/22/2019   IBS (irritable bowel syndrome) 05/13/2021   Rheumatoid arthritis (HCC)    Uveitis     Past Surgical History:  Procedure Laterality Date   CATARACT EXTRACTION Right    COLONOSCOPY     GLAUCOMA SURGERY       Current Outpatient Medications  Medication Sig Dispense Refill   acetaminophen  (TYLENOL ) 650 MG CR tablet Take 1,300 mg by mouth every 8 (eight) hours as needed for pain.     busPIRone (BUSPAR) 7.5 MG tablet Take 7.5 mg by mouth 2 (two) times daily.     dicyclomine  (BENTYL ) 20 MG tablet TAKE 1 TABLET BY MOUTH EVERY 6 HOURS 30 tablet 1   diphenoxylate -atropine  (LOMOTIL ) 2.5-0.025 MG tablet TAKE 1 TABLET 4 TIMES DAILY AS NEEDED FOR DIARRHEA OR LOOSE STOLLS 30 tablet 0   folic acid (FOLVITE) 1 MG tablet Take 2 tablets by mouth daily at 12 noon.     Golimumab (SIMPONI) 50 MG/0.5ML SOAJ Inject 0.5 mLs into the skin every 28 (twenty-eight) days.     hydroxychloroquine (PLAQUENIL) 200 MG tablet Take 200 mg by mouth daily.     Methotrexate, PF, 25 MG/0.5ML SOAJ Inject into the skin.     montelukast  (SINGULAIR ) 10 MG tablet Take one tablet once daily to prevent as directed 90 tablet 1   ondansetron  (ZOFRAN ) 4 MG tablet TAKE 1 TABLET (4 MG TOTAL) BY MOUTH EVERY 6 (SIX) HOURS AS NEEDED FOR NAUSEA OR VOMITING 9 tablet 6   prednisoLONE acetate (PRED FORTE) 1 % ophthalmic suspension 1 drop every other day.     fluticasone  (FLONASE ) 50 MCG/ACT nasal spray  Place 1 spray into both nostrils daily. 11.1 mL 0   methotrexate (RHEUMATREX) 2.5 MG tablet Take 2.5 mg by mouth once a week.     predniSONE  (DELTASONE ) 5 MG tablet Take 10 mg by mouth daily with breakfast. Prednisone  taper (Patient not taking: Reported on 04/14/2024)     No current facility-administered medications for this visit.     ALLERGIES: Sulfamethoxazole-trimethoprim, Ibuprofen, Imodium [loperamide], Sulfamethoxazole, and Adalimumab  Family History  Problem Relation Age of Onset   Colon polyps Mother    Colon polyps Maternal Grandmother    Esophageal cancer Maternal Grandmother    Diabetes Maternal Grandmother    Colon cancer Neg Hx    Stomach cancer Neg Hx     Social History   Socioeconomic History   Marital status: Married     Spouse name: Not on file   Number of children: Not on file   Years of education: Not on file   Highest education level: Not on file  Occupational History   Not on file  Tobacco Use   Smoking status: Never   Smokeless tobacco: Never  Vaping Use   Vaping status: Never Used  Substance and Sexual Activity   Alcohol use: Not Currently   Drug use: Not Currently   Sexual activity: Yes    Partners: Female    Birth control/protection: None  Other Topics Concern   Not on file  Social History Narrative   Not on file   Social Drivers of Health   Financial Resource Strain: Not on file  Food Insecurity: Not on file  Transportation Needs: Not on file  Physical Activity: Not on file  Stress: Not on file  Social Connections: Unknown (10/29/2021)   [facility]   Social Network    Social Network: Not on file  Intimate Partner Violence: Unknown (09/16/2021)   Received from [facility]   HITS    Physically Hurt: Not on file    Insult or Talk Down To: Not on file    Threaten Physical Harm: Not on file    Scream or Curse: Not on file    Review of Systems  PHYSICAL EXAMINATION:    BP 113/69   Pulse 70   Ht 5' 3 (1.6 m)   Wt 118 lb 3.2 oz (53.6 kg)   LMP 04/09/2024 (Exact Date)   BMI 20.94 kg/m     General appearance: alert, cooperative and appears stated age Head: Normocephalic, without obvious abnormality, atraumatic Neck: no adenopathy, supple, symmetrical, trachea midline and thyroid normal to inspection and palpation Lungs: clear to auscultation bilaterally Heart: regular rate and rhythm Abdomen: soft, non-tender, no masses,  no organomegaly Extremities: extremities normal, atraumatic, no cyanosis or edema Neurologic: Grossly normal  Chaperone was present for exam  ASSESSMENT & PLAN   1. Abnormal uterine bleeding (Primary) Pleasant 28 year old patient presenting today for polymenorrhea and intermittent intermenstrual spotting. She is currently on no  current hormonal medication. The patient is well-appearing with no active vaginal bleeding by her report today. I examined the patient last month during her annual visit and found no visible or palpable abnormalities of the pelvis.   Ddx includes uterine fibroids, endometrial polyp, ovulatory dysfunction, infectious etiology. Possible endometriosis or adenomyosis, though no HMB at this time. Dysmenorrhea well controlled on Tylenol . No family/personal history bleeding disorder, easy bruising, or HMB since menarche to suggest coagulopathy. Last pap 03/23/24, NILM.   Work-up will include TVUS and AUB labs to rule out structural abnormalities and  other endocrine pathology. Will follow results. Will send OCPs to take in case work-up is negative for regulating periods. Patient has no known contraindications or medication interactions with OCP therapy. Patient states understanding and agreement with aforementioned plan.    - Ambulatory referral to Infertility - TSH Rfx on Abnormal to Free T4 - CBC - Cervicovaginal ancillary only - US  PELVIC COMPLETE WITH TRANSVAGINAL; Future  - levonorgestrel-ethinyl estradiol (AUBRA EQ) 0.1-20 MG-MCG tablet; Take 1 tablet by mouth daily.  Dispense: 90 tablet; Refill: 3    An After Visit Summary was printed and given to the patient.  Shakema Surita E Janalee Grobe, PA-C 10/30/20254:35 PM

## 2024-04-15 LAB — CERVICOVAGINAL ANCILLARY ONLY
Bacterial Vaginitis (gardnerella): NEGATIVE
Candida Glabrata: NEGATIVE
Candida Vaginitis: NEGATIVE
Chlamydia: NEGATIVE
Comment: NEGATIVE
Comment: NEGATIVE
Comment: NEGATIVE
Comment: NEGATIVE
Comment: NEGATIVE
Comment: NORMAL
Neisseria Gonorrhea: NEGATIVE
Trichomonas: NEGATIVE

## 2024-04-16 ENCOUNTER — Ambulatory Visit: Payer: Self-pay | Admitting: Physician Assistant

## 2024-04-18 ENCOUNTER — Other Ambulatory Visit

## 2024-04-18 ENCOUNTER — Ambulatory Visit (HOSPITAL_BASED_OUTPATIENT_CLINIC_OR_DEPARTMENT_OTHER)
Admission: RE | Admit: 2024-04-18 | Discharge: 2024-04-18 | Disposition: A | Discharge status: Home / Self Care | Source: Ambulatory Visit | Attending: Physician Assistant | Admitting: Physician Assistant

## 2024-04-18 DIAGNOSIS — N939 Abnormal uterine and vaginal bleeding, unspecified: Secondary | ICD-10-CM | POA: Insufficient documentation

## 2024-04-19 LAB — CBC
Hematocrit: 35.7 % (ref 34.0–46.6)
Hemoglobin: 11.5 g/dL (ref 11.1–15.9)
MCH: 28.4 pg (ref 26.6–33.0)
MCHC: 32.2 g/dL (ref 31.5–35.7)
MCV: 88 fL (ref 79–97)
Platelets: 314 x10E3/uL (ref 150–450)
RBC: 4.05 x10E6/uL (ref 3.77–5.28)
RDW: 15 % (ref 11.7–15.4)
WBC: 6.4 x10E3/uL (ref 3.4–10.8)

## 2024-04-19 LAB — TSH RFX ON ABNORMAL TO FREE T4: TSH: 1.61 u[IU]/mL (ref 0.450–4.500)

## 2024-04-23 ENCOUNTER — Other Ambulatory Visit: Payer: Self-pay | Admitting: Allergy and Immunology

## 2024-04-24 MED ORDER — LEVONORGESTREL-ETHINYL ESTRAD 0.1-20 MG-MCG PO TABS: 1.0000 | ORAL_TABLET | Freq: Every day | ORAL | 3 refills | Status: DC

## 2024-05-05 ENCOUNTER — Ambulatory Visit: Admitting: Family Medicine

## 2024-05-05 ENCOUNTER — Encounter: Payer: Self-pay | Admitting: Family Medicine

## 2024-05-05 ENCOUNTER — Other Ambulatory Visit: Payer: Self-pay

## 2024-05-05 VITALS — BP 100/60 | HR 68 | Temp 98.3°F

## 2024-05-05 DIAGNOSIS — J301 Allergic rhinitis due to pollen: Secondary | ICD-10-CM

## 2024-05-05 DIAGNOSIS — L500 Allergic urticaria: Secondary | ICD-10-CM

## 2024-05-05 DIAGNOSIS — J3089 Other allergic rhinitis: Secondary | ICD-10-CM

## 2024-05-05 DIAGNOSIS — H1013 Acute atopic conjunctivitis, bilateral: Secondary | ICD-10-CM

## 2024-05-05 DIAGNOSIS — H1010 Acute atopic conjunctivitis, unspecified eye: Secondary | ICD-10-CM

## 2024-05-05 MED ORDER — MONTELUKAST SODIUM 10 MG PO TABS: ORAL_TABLET | ORAL | 1 refills | Status: DC

## 2024-05-05 MED ORDER — CETIRIZINE HCL 10 MG PO TABS
10.0000 mg | ORAL_TABLET | Freq: Every day | ORAL | 5 refills | Status: AC
Start: 2024-05-05 — End: ?

## 2024-05-05 MED ORDER — MONTELUKAST SODIUM 10 MG PO TABS: ORAL_TABLET | ORAL | 3 refills | Status: AC

## 2024-05-05 NOTE — Patient Instructions (Addendum)
  1.  Continue to perform allergen avoidance measures - dog, dust mite, pollen, mold  2.  Continue to treat and prevent inflammation:   A. Montelukast  10 mg - 1 tablet 1 time per day  B. Flonase - 1-2 sprays each nostril 3-7 times per week  3. If needed:   A. Cetirizine  10 mg daily  B. Saline nasal rinse  4. Return to clinic in 1 year or earlier if problem

## 2024-05-05 NOTE — Progress Notes (Signed)
 [address] [place] [address] Dept: [phone]  FOLLOW UP NOTE  Patient ID: Sydney Zimmerman, female    DOB: [birth year]  Age: 28 y.o. MRN: [id number] Date of Office Visit: 05/05/2024  Assessment  Chief Complaint: Follow-up and Medication Refill  HPI Sydney Zimmerman is a 28 year old female who presents to the clinic for follow-up visit.  She was last seen in this clinic on 07/29/2022 by Dr. Kozlow for evaluation of allergic rhinitis, allergic conjunctivitis, and urticaria.  Her problem list includes rheumatoid arthritis, Crohn's, and uveitis.  Discussed the use of AI scribe software for clinical note transcription with the patient, who gave verbal consent to proceed.  History of Present Illness Sydney Zimmerman is a 28 year old female who presents with nasal congestion and allergy  symptoms.  She experiences intermittent nasal congestion, rhinorrhea, sneezing, and post-nasal drip, particularly in the mornings. She reports Her symptoms have improved since moving to a new place where her dog is kept outside.  She is currently taking montelukast  and uses cetirizine  as needed, especially during the spring when her symptoms worsen.  She continues montelukast  10 mg once a day and infrequently uses a nasal steroid spray.  She reports poor steroid nasal spray application techniq.  She reports that nasal steroid spray and antihistamines are not covered under her insurance currently. Her last environmental allergy  skin testing was on 04/10/2020 and was positive to grass pollen, mold mix 3, mold mix 4, dog, and dust mite.  Allergic conjunctivitis is reported as moderately well-controlled with dry eyes as the main symptom.  She does have a history of uveitis.  She is not currently using an allergy  eyedrop.  She denies any episodes of urticaria since her last visit to this clinic.  Her current medications are listed in the chart.   Drug Allergies:  Allergies  Allergen Reactions    Sulfamethoxazole-Trimethoprim Hives and Itching    Patient developed urticarial rash 3-4 days after starting Bactrim    Ibuprofen    Imodium [Loperamide] Swelling   Sulfamethoxazole Itching   Adalimumab Rash    Physical Exam: BP 100/60   Pulse 68   Temp 98.3 F (36.8 C)   LMP 04/09/2024 (Exact Date)   SpO2 100%    Physical Exam Vitals reviewed.  Constitutional:      Appearance: Normal appearance.  HENT:     Head: Normocephalic and atraumatic.     Right Ear: Tympanic membrane normal.     Left Ear: Tympanic membrane normal.     Nose:     Comments: Bilateral naris edematous and pale with thin clear nasal drainage noted.  Right nare more edematous than left.  Pharynx normal.  Ears normal.  Eyes normal.    Mouth/Throat:     Pharynx: Oropharynx is clear.  Eyes:     Conjunctiva/sclera: Conjunctivae normal.  Cardiovascular:     Rate and Rhythm: Normal rate and regular rhythm.     Heart sounds: Normal heart sounds. No murmur heard. Pulmonary:     Effort: Pulmonary effort is normal.     Breath sounds: Normal breath sounds.     Comments: Lungs clear to auscultation Musculoskeletal:        General: Normal range of motion.     Cervical back: Normal range of motion and neck supple.  Skin:    General: Skin is warm and dry.  Neurological:     Mental Status: She is alert and oriented to person, place, and time.  Psychiatric:  Mood and Affect: Mood normal.        Behavior: Behavior normal.        Thought Content: Thought content normal.        Judgment: Judgment normal.     Assessment and Plan: 1. Perennial allergic rhinitis   2. Seasonal allergic rhinitis due to pollen   3. Allergic urticaria   4. Seasonal allergic conjunctivitis     Meds ordered this encounter  Medications   montelukast  (SINGULAIR ) 10 MG tablet    Sig: Take one tablet once daily to prevent as directed    Dispense:  90 tablet    Refill:  1   cetirizine (ZYRTEC) 10 MG tablet    Sig: Take 1  tablet (10 mg total) by mouth daily.    Dispense:  30 tablet    Refill:  5    Patient Instructions   1.  Continue to perform allergen avoidance measures - dog, dust mite, pollen, mold  2.  Continue to treat and prevent inflammation:   A. Montelukast  10 mg - 1 tablet 1 time per day  B. Flonase - 1-2 sprays each nostril 3-7 times per week  3. If needed:   A. Cetirizine 10 mg daily  B. Saline nasal rinse  4. Return to clinic in 1 year or earlier if problem  No follow-ups on file.    Thank you for the opportunity to care for this patient.  Please do not hesitate to contact me with questions.  Arlean Mutter, FNP [facility] 

## 2024-05-06 ENCOUNTER — Encounter: Payer: Self-pay | Admitting: Physician Assistant

## 2024-05-09 ENCOUNTER — Other Ambulatory Visit: Payer: Self-pay

## 2024-05-09 DIAGNOSIS — Z3169 Encounter for other general counseling and advice on procreation: Secondary | ICD-10-CM

## 2024-07-21 ENCOUNTER — Telehealth: Payer: Self-pay | Admitting: Physician Assistant

## 2024-07-21 NOTE — Progress Notes (Unsigned)
 Pt presents for u/s f/u. Pt has no questions or concerns at this time.
